# Patient Record
Sex: Female | Born: 1990 | Race: Black or African American | Hispanic: No | Marital: Single | Smoking: Never smoker
Health system: Southern US, Community
[De-identification: ages and names within clinical notes are randomized; demographics above are authoritative.]

## PROBLEM LIST (undated history)

## (undated) ENCOUNTER — Emergency Department (HOSPITAL_COMMUNITY): Payer: Medicaid Other

## (undated) ENCOUNTER — Inpatient Hospital Stay (HOSPITAL_COMMUNITY): Payer: Self-pay

## (undated) ENCOUNTER — Emergency Department (HOSPITAL_BASED_OUTPATIENT_CLINIC_OR_DEPARTMENT_OTHER): Admission: EM | Payer: 59 | Source: Home / Self Care

## (undated) DIAGNOSIS — F419 Anxiety disorder, unspecified: Secondary | ICD-10-CM

## (undated) DIAGNOSIS — G8929 Other chronic pain: Secondary | ICD-10-CM

## (undated) DIAGNOSIS — I499 Cardiac arrhythmia, unspecified: Secondary | ICD-10-CM

## (undated) DIAGNOSIS — F32A Depression, unspecified: Secondary | ICD-10-CM

## (undated) DIAGNOSIS — O139 Gestational [pregnancy-induced] hypertension without significant proteinuria, unspecified trimester: Secondary | ICD-10-CM

## (undated) DIAGNOSIS — Z8619 Personal history of other infectious and parasitic diseases: Secondary | ICD-10-CM

## (undated) DIAGNOSIS — G510 Bell's palsy: Secondary | ICD-10-CM

## (undated) DIAGNOSIS — E669 Obesity, unspecified: Secondary | ICD-10-CM

## (undated) DIAGNOSIS — F329 Major depressive disorder, single episode, unspecified: Secondary | ICD-10-CM

## (undated) DIAGNOSIS — M549 Dorsalgia, unspecified: Secondary | ICD-10-CM

## (undated) HISTORY — PX: NO PAST SURGERIES: SHX2092

---
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
## 2010-09-14 ENCOUNTER — Emergency Department (HOSPITAL_BASED_OUTPATIENT_CLINIC_OR_DEPARTMENT_OTHER)
Admission: EM | Admit: 2010-09-14 | Discharge: 2010-09-14 | Disposition: A | Discharge status: Home / Self Care | Payer: 59 | Attending: Emergency Medicine | Admitting: Emergency Medicine

## 2010-09-14 ENCOUNTER — Emergency Department (INDEPENDENT_AMBULATORY_CARE_PROVIDER_SITE_OTHER): Payer: 59

## 2010-09-14 DIAGNOSIS — L0390 Cellulitis, unspecified: Secondary | ICD-10-CM

## 2010-09-14 DIAGNOSIS — G8929 Other chronic pain: Secondary | ICD-10-CM | POA: Insufficient documentation

## 2010-09-14 DIAGNOSIS — L0291 Cutaneous abscess, unspecified: Secondary | ICD-10-CM

## 2010-09-14 DIAGNOSIS — E669 Obesity, unspecified: Secondary | ICD-10-CM | POA: Insufficient documentation

## 2010-09-14 DIAGNOSIS — S39012A Strain of muscle, fascia and tendon of lower back, initial encounter: Secondary | ICD-10-CM

## 2010-09-14 DIAGNOSIS — M545 Low back pain, unspecified: Secondary | ICD-10-CM

## 2010-09-14 DIAGNOSIS — J45909 Unspecified asthma, uncomplicated: Secondary | ICD-10-CM | POA: Insufficient documentation

## 2010-09-14 DIAGNOSIS — X58XXXA Exposure to other specified factors, initial encounter: Secondary | ICD-10-CM | POA: Insufficient documentation

## 2010-09-14 DIAGNOSIS — IMO0002 Reserved for concepts with insufficient information to code with codable children: Secondary | ICD-10-CM | POA: Insufficient documentation

## 2010-09-14 HISTORY — DX: Other chronic pain: G89.29

## 2010-09-14 HISTORY — DX: Obesity, unspecified: E66.9

## 2010-09-14 HISTORY — DX: Dorsalgia, unspecified: M54.9

## 2010-09-14 LAB — URINALYSIS, ROUTINE W REFLEX MICROSCOPIC
Bilirubin Urine: NEGATIVE
Glucose, UA: NEGATIVE mg/dL
Ketones, ur: NEGATIVE mg/dL
Leukocytes, UA: NEGATIVE
Nitrite: NEGATIVE
Protein, ur: NEGATIVE mg/dL

## 2010-09-14 MED ORDER — HYDROCODONE-ACETAMINOPHEN 5-325 MG PO TABS
2.0000 | ORAL_TABLET | Freq: Once | ORAL | Status: AC
  Administered 2010-09-14: 2 via ORAL
  Filled 2010-09-14: qty 2

## 2010-09-14 MED ORDER — SULFAMETHOXAZOLE-TRIMETHOPRIM 800-160 MG PO TABS: 1.0000 | ORAL_TABLET | Freq: Two times a day (BID) | ORAL | Status: AC

## 2010-09-14 MED ORDER — DIAZEPAM 5 MG PO TABS: 5.0000 mg | ORAL_TABLET | Freq: Two times a day (BID) | ORAL | Status: AC

## 2010-09-14 MED ORDER — HYDROCODONE-ACETAMINOPHEN 5-500 MG PO TABS: 1.0000 | ORAL_TABLET | Freq: Four times a day (QID) | ORAL | Status: AC | PRN

## 2010-09-14 NOTE — ED Provider Notes (Addendum)
History     CSN: [id number] Arrival date & time: 09/14/2010  1:41 AM  Chief Complaint  Patient presents with  . Back Pain   Patient is a 20 y.o. female presenting with back pain. The history is provided by the patient.  Back Pain  This is a recurrent problem. The current episode started more than 1 week ago. The problem has been gradually worsening. The pain is associated with no known injury. The pain is present in the lumbar spine. The quality of the pain is described as burning. The pain does not radiate. The pain is moderate. The symptoms are aggravated by bending. Pertinent negatives include no chest pain, no fever, no abdominal pain, no bowel incontinence, no bladder incontinence, no dysuria, no pelvic pain, no tingling and no weakness. Risk factors include obesity.    Past Medical History  Diagnosis Date  . Obesity   . Chronic back pain     d/t "the size of my boobs"  . Asthma     History reviewed. No pertinent past surgical history.  History reviewed. No pertinent family history.  History  Substance Use Topics  . Smoking status: Never Smoker   . Smokeless tobacco: Never Used  . Alcohol Use: No    OB History    Grav Para Term Preterm Abortions TAB SAB Ect Mult Living                  Review of Systems  Constitutional: Negative for fever.  HENT: Negative.   Eyes: Negative.   Respiratory: Negative.   Cardiovascular: Negative.  Negative for chest pain.  Gastrointestinal: Negative.  Negative for abdominal pain and bowel incontinence.  Genitourinary: Negative for bladder incontinence, dysuria, frequency, hematuria, flank pain, vaginal bleeding, vaginal discharge, difficulty urinating and pelvic pain.  Musculoskeletal: Positive for back pain.  Neurological: Negative for dizziness, tingling and weakness.    Physical Exam  BP 132/91  Pulse 90  Temp(Src) 98.3 F (36.8 C) (Oral)  Ht 4\' 10"  (1.473 m)  Wt 180 lb (81.647 kg)  BMI 37.62 kg/m2  SpO2 100%  LMP  08/24/2010  Physical Exam  Constitutional: She appears well-developed and well-nourished.  HENT:  Head: Normocephalic and atraumatic.  Eyes: Pupils are equal, round, and reactive to light.  Neck: Normal range of motion. Neck supple.  Cardiovascular: Normal rate, regular rhythm and normal heart sounds.   Pulmonary/Chest: Effort normal and breath sounds normal.  Abdominal: Soft. Bowel sounds are normal. There is no tenderness.  Musculoskeletal: Normal range of motion.       Mild tenderness to left lower lumbar paraspinal area  Neurological: She is alert. She has normal strength. No sensory deficit.  Skin: Skin is warm and dry.    ED Course  Procedures  MDM  Results for orders placed during the hospital encounter of 09/14/10  URINALYSIS, ROUTINE W REFLEX MICROSCOPIC      Component Value Range   Color, Urine YELLOW  YELLOW    Appearance CLEAR  CLEAR    Specific Gravity, Urine 1.035 (*) 1.005 - 1.030    pH 5.5  5.0 - 8.0    Glucose, UA NEGATIVE  NEGATIVE (mg/dL)   Hgb urine dipstick NEGATIVE  NEGATIVE    Bilirubin Urine NEGATIVE  NEGATIVE    Ketones, ur NEGATIVE  NEGATIVE (mg/dL)   Protein, ur NEGATIVE  NEGATIVE (mg/dL)   Urobilinogen, UA 0.2  0.0 - 1.0 (mg/dL)   Nitrite NEGATIVE  NEGATIVE    Leukocytes, UA NEGATIVE  NEGATIVE  PREGNANCY, URINE      Component Value Range   Preg Test, Ur NEGATIVE     Dg Lumbar Spine Complete  09/14/2010  *RADIOLOGY REPORT*  Clinical Data: Lower back pain.  LUMBAR SPINE - COMPLETE 4+ VIEW  Comparison: None.  Findings: There is no evidence of fracture or subluxation. Vertebral bodies demonstrate normal height and alignment. Intervertebral disc spaces are preserved.  The visualized neural foramina are grossly unremarkable in appearance.  The visualized bowel gas pattern is unremarkable in appearance; air and stool are noted within the colon.  The sacroiliac joints are within normal limits.  IMPRESSION: No evidence of fracture or subluxation along the  lumbar spine.  Original Report Authenticated By: Tonia Ghent, M.D.    At discharge, pt asked me to look at place on her arm, has <1cm abscess to right axilla.  Looks like early abscess, no cellulits, not amenable at this point to I&D.  Will prescribe bactrim, advised to use hot compresses, return for any worsening.      Rolan Bucco, MD 09/14/10 1610  Rolan Bucco, MD 09/14/10 [phone]

## 2010-09-14 NOTE — ED Notes (Signed)
Pt given additional prescription and paper work. Pt to be dc'd home.

## 2010-09-14 NOTE — ED Notes (Signed)
Upon discharge, pt states there is a small bump under her right axilla that she would like the MD to look at. MD made aware.

## 2010-09-14 NOTE — ED Notes (Signed)
Pt presents tonight with multiple complaints.  Pt states that she has L lower back pain, L heel pain, and a bump in the R axilla.  Pt states that she is concerned about a kidney stone.  Denies dysuria, fever, chills, nausea, vomiting.

## 2014-02-28 ENCOUNTER — Emergency Department (HOSPITAL_COMMUNITY)
Admission: EM | Admit: 2014-02-28 | Discharge: 2014-03-01 | Disposition: A | Discharge status: Home / Self Care | Payer: Medicaid Other | Attending: Emergency Medicine | Admitting: Emergency Medicine

## 2014-02-28 ENCOUNTER — Emergency Department (HOSPITAL_COMMUNITY): Payer: Medicaid Other

## 2014-02-28 ENCOUNTER — Encounter (HOSPITAL_COMMUNITY): Payer: Self-pay | Admitting: Adult Health

## 2014-02-28 DIAGNOSIS — G8929 Other chronic pain: Secondary | ICD-10-CM | POA: Diagnosis not present

## 2014-02-28 DIAGNOSIS — R51 Headache: Secondary | ICD-10-CM | POA: Diagnosis present

## 2014-02-28 DIAGNOSIS — E669 Obesity, unspecified: Secondary | ICD-10-CM | POA: Insufficient documentation

## 2014-02-28 DIAGNOSIS — J45909 Unspecified asthma, uncomplicated: Secondary | ICD-10-CM | POA: Insufficient documentation

## 2014-02-28 DIAGNOSIS — R519 Headache, unspecified: Secondary | ICD-10-CM

## 2014-02-28 MED ORDER — SODIUM CHLORIDE 0.9 % IV BOLUS (SEPSIS)
1000.0000 mL | Freq: Once | INTRAVENOUS | Status: AC
  Administered 2014-02-28: 1000 mL via INTRAVENOUS

## 2014-02-28 MED ORDER — KETOROLAC TROMETHAMINE 30 MG/ML IJ SOLN
30.0000 mg | Freq: Once | INTRAMUSCULAR | Status: AC
  Administered 2014-02-28: 30 mg via INTRAVENOUS
  Filled 2014-02-28: qty 1

## 2014-02-28 MED ORDER — DIPHENHYDRAMINE HCL 50 MG/ML IJ SOLN
25.0000 mg | Freq: Once | INTRAMUSCULAR | Status: AC
  Administered 2014-02-28: 25 mg via INTRAVENOUS
  Filled 2014-02-28: qty 1

## 2014-02-28 MED ORDER — METOCLOPRAMIDE HCL 5 MG/ML IJ SOLN
10.0000 mg | Freq: Once | INTRAMUSCULAR | Status: AC
  Administered 2014-02-28: 10 mg via INTRAVENOUS
  Filled 2014-02-28: qty 2

## 2014-02-28 NOTE — ED Notes (Signed)
PA student at bedside.

## 2014-02-28 NOTE — ED Provider Notes (Signed)
CSN: [id number]     Arrival date & time 02/28/14  2218 History  This chart was scribed for non-physician practitioner, Roxy Horseman, PA-C working with Mirian Mo, MD by Luisa Dago, ED scribe. This patient was seen in room TR09C/TR09C and the patient's care was started at 11:06 PM.    Chief Complaint  Patient presents with  . Headache   The history is provided by the patient and medical records. No language interpreter was used.   HPI Comments: Madison Weiss is a 24 y.o. female who presents to the Emergency Department with a chief complaint of waxing and waning headache with onset approximately 3 weeks ago. Pt states that the pain is mostly localized to the left side of her head. She describes the pain as "throbbing" in nature. pt states that a couple of days ago she experienced an episode of blurry vision 2 days ago, but symptom is now resolved. Pt states that she has never experienced persistent headaches such as these. Usually they only last for a day. Pt states that the headache is at its worse upon waking in the AM and worse at night right before going to sleep. She admits to being seen by her PCP who advised her to take Ibuprofen or tylenol to alleviate her pain. Reports taking those OTC medications around the clock, but they have not provided substantial relief of her discomfort. No medicinal allergies. Denies any chance of pregnancy. Pt denies fever, neck pain, sore throat, visual disturbance, CP, cough, SOB, abdominal pain, nausea, emesis, diarrhea, urinary symptoms, back pain, weakness, numbness and rash as associated symptoms.      Past Medical History  Diagnosis Date  . Obesity   . Chronic back pain     d/t "the size of my boobs"  . Asthma    History reviewed. No pertinent past surgical history. History reviewed. No pertinent family history. History  Substance Use Topics  . Smoking status: Never Smoker   . Smokeless tobacco: Never Used  . Alcohol Use: No   OB  History    No data available     Review of Systems  Constitutional: Negative for fever and chills.  HENT: Negative for congestion.   Respiratory: Negative for shortness of breath.   Cardiovascular: Negative for chest pain.  Gastrointestinal: Negative for nausea, vomiting and abdominal pain.  Neurological: Positive for headaches. Negative for dizziness, weakness, light-headedness and numbness.   Allergies  Review of patient's allergies indicates no known allergies.  Home Medications   Prior to Admission medications   Medication Sig Start Date End Date Taking? Authorizing Provider  UNKNOWN TO PATIENT Birth control pills As prescribed      Historical Provider, MD   BP 124/80 mmHg  Pulse 105  Temp(Src) 97.8 F (36.6 C) (Oral)  Resp 18  SpO2 100%  LMP 02/14/2014 (Approximate)   Physical Exam  Constitutional: She is oriented to person, place, and time. She appears well-developed and well-nourished. No distress.  HENT:  Head: Normocephalic and atraumatic.  Right Ear: External ear normal.  Left Ear: External ear normal.  Eyes: Conjunctivae and EOM are normal. Pupils are equal, round, and reactive to light.  Neck: Normal range of motion. Neck supple. No tracheal deviation present.  No pain with neck flexion, no meningismus  Cardiovascular: Normal rate, regular rhythm and normal heart sounds.  Exam reveals no gallop and no friction rub.   No murmur heard. Pulmonary/Chest: Effort normal and breath sounds normal. No respiratory distress. She has no wheezes. She  has no rales. She exhibits no tenderness.  Abdominal: Soft. She exhibits no distension and no mass. There is no tenderness. There is no rebound and no guarding.  Musculoskeletal: Normal range of motion. She exhibits no edema or tenderness.  Normal gait.  Neurological: She is alert and oriented to person, place, and time. She has normal reflexes.  CN 3-12 intact, normal finger to nose, no pronator drift, sensation and  strength intact bilaterally.  Skin: Skin is warm and dry.  Psychiatric: She has a normal mood and affect. Her behavior is normal. Judgment and thought content normal.  Nursing note and vitals reviewed.   ED Course  Procedures (including critical care time)  DIAGNOSTIC STUDIES: Oxygen Saturation is 100% on RA, normal by my interpretation.    COORDINATION OF CARE: 11:10 PM- Will order a migraine cocktail. Advised pt to follow up with a neurologist to be evaluated. Pt advised of plan for treatment and pt agrees.  Medications  sodium chloride 0.9 % bolus 1,000 mL (not administered)  diphenhydrAMINE (BENADRYL) injection 25 mg (not administered)  ketorolac (TORADOL) 30 MG/ML injection 30 mg (not administered)  metoCLOPramide (REGLAN) injection 10 mg (not administered)   Imaging Review Ct Head Wo Contrast  03/01/2014   CLINICAL DATA:  Left-sided headache for 3 weeks.  EXAM: CT HEAD WITHOUT CONTRAST  TECHNIQUE: Contiguous axial images were obtained from the base of the skull through the vertex without intravenous contrast.  COMPARISON:  None.  FINDINGS: No intracranial hemorrhage, mass effect, or midline shift. No hydrocephalus. The basilar cisterns are patent. No evidence of territorial infarct. No intracranial fluid collection. Calvarium is intact. Minimal mucosal thickening in the sphenoid sinus. The mastoid air cells are well aerated.  IMPRESSION: 1. No acute intracranial abnormality. 2. Minimal mucosal thickening in the sphenoid sinus.   Electronically Signed   By: Rubye OaksMelanie  Ehinger M.D.   On: 03/01/2014 01:34    Images of head CT reviewed in the PACS system by me personally, I agree with radiologist's impression, no obvious mass or tumor.  MDM   Final diagnoses:  Headache, unspecified headache type    Patient with headache.  Persistent x 3 weeks.  Will check head CT to rule out any space occupying lesion.  Will give toradol, reglan, benadryl, and fluids.  12:10 AM Patient feels  shaky.  Likely dystonic reaction from reglan.  Patient refused additional benadryl.  Reassessed.  Patient states that the headache has significantly improved, but she still feels a little shaky.  Will continue fluids, CT scan pending.  CT is negative for mass or tumor.  Neurologically intact.  HA is now a 2/10.  Will discharge to home with neurology follow-up.  Patient understands and agrees with the plan.  She is stable and ready for discharge.  Pt HA treated and improved while in ED.  Presentation is like pts typical HA and non concerning for [facility], ICH, Meningitis, or temporal arteritis. Pt is afebrile with no focal neuro deficits, nuchal rigidity, or change in vision. Pt is to follow up with PCP to discuss prophylactic medication. Pt verbalizes understanding and is agreeable with plan to dc.    I personally performed the services described in this documentation, which was scribed in my presence. The recorded information has been reviewed and is accurate.    Roxy Horsemanobert Criss Bartles, PA-C 03/01/14 0139  Mirian MoMatthew Gentry, MD 03/01/14 [phone]

## 2014-02-28 NOTE — ED Notes (Addendum)
Pt reports front left sided headache that started 3wks ago. Pt reports pain is a throbbing sensation. Pt states she saw her pcp 3wks ago for same and was told to take ibuprofen/tylenol with no relief. Pt rates pain 6/10, states she has light and smell sensitivity with nausea.

## 2014-02-28 NOTE — ED Notes (Signed)
Presents with a headache for three weeks mostly on the left side of her head associated with blurry vision at times and nausea and light sensitivity. She has tried tylenol, ibuprofen with no relief. She is alert, orietned, no drift or droop. Headache is constant.

## 2014-03-01 ENCOUNTER — Encounter (HOSPITAL_COMMUNITY): Payer: Self-pay

## 2014-03-01 MED ORDER — DIPHENHYDRAMINE HCL 50 MG/ML IJ SOLN: 25.0000 mg | Freq: Once | INTRAMUSCULAR | Status: DC

## 2014-03-01 NOTE — Discharge Instructions (Signed)

## 2014-03-01 NOTE — ED Notes (Signed)
Patient transported to CT 

## 2014-03-01 NOTE — ED Notes (Addendum)
Pt starting having anxiety reaction to Reglan;brought back from CT w/o scan being completed; PA notified;pt states she does not want anything else; pt was moved to stretcher and placed in FastTrack 11;

## 2014-03-01 NOTE — ED Notes (Signed)
PA at bedside.

## 2014-03-30 ENCOUNTER — Other Ambulatory Visit (HOSPITAL_COMMUNITY): Payer: Self-pay | Admitting: Nurse Practitioner

## 2014-03-30 DIAGNOSIS — Z3A12 12 weeks gestation of pregnancy: Secondary | ICD-10-CM

## 2014-03-30 DIAGNOSIS — Z3689 Encounter for other specified antenatal screening: Secondary | ICD-10-CM

## 2014-03-30 DIAGNOSIS — Z3682 Encounter for antenatal screening for nuchal translucency: Secondary | ICD-10-CM

## 2014-03-30 LAB — OB RESULTS CONSOLE HIV ANTIBODY (ROUTINE TESTING): HIV: NONREACTIVE

## 2014-03-30 LAB — OB RESULTS CONSOLE GC/CHLAMYDIA
CHLAMYDIA, DNA PROBE: NEGATIVE
GC PROBE AMP, GENITAL: NEGATIVE

## 2014-03-30 LAB — OB RESULTS CONSOLE HEPATITIS B SURFACE ANTIGEN: Hepatitis B Surface Ag: NEGATIVE

## 2014-03-30 LAB — OB RESULTS CONSOLE ABO/RH: RH TYPE: POSITIVE

## 2014-03-30 LAB — OB RESULTS CONSOLE RPR: RPR: NONREACTIVE

## 2014-03-30 LAB — OB RESULTS CONSOLE RUBELLA ANTIBODY, IGM: Rubella: NON-IMMUNE/NOT IMMUNE

## 2014-05-03 ENCOUNTER — Encounter (HOSPITAL_COMMUNITY): Payer: Self-pay

## 2014-05-03 ENCOUNTER — Ambulatory Visit (HOSPITAL_COMMUNITY)
Admission: RE | Admit: 2014-05-03 | Discharge: 2014-05-03 | Disposition: A | Discharge status: Home / Self Care | Payer: Medicaid Other | Source: Ambulatory Visit | Attending: Nurse Practitioner | Admitting: Nurse Practitioner

## 2014-05-03 DIAGNOSIS — Z3A12 12 weeks gestation of pregnancy: Secondary | ICD-10-CM | POA: Diagnosis not present

## 2014-05-03 DIAGNOSIS — Z36 Encounter for antenatal screening of mother: Secondary | ICD-10-CM | POA: Insufficient documentation

## 2014-05-03 DIAGNOSIS — Z3682 Encounter for antenatal screening for nuchal translucency: Secondary | ICD-10-CM

## 2014-05-03 HISTORY — DX: Anxiety disorder, unspecified: F41.9

## 2014-05-03 HISTORY — DX: Major depressive disorder, single episode, unspecified: F32.9

## 2014-05-03 HISTORY — DX: Depression, unspecified: F32.A

## 2014-05-09 ENCOUNTER — Other Ambulatory Visit (HOSPITAL_COMMUNITY): Payer: Self-pay | Admitting: Nurse Practitioner

## 2014-06-23 ENCOUNTER — Ambulatory Visit (HOSPITAL_COMMUNITY): Admission: RE | Admit: 2014-06-23 | Payer: Medicaid Other | Source: Ambulatory Visit

## 2014-07-17 LAB — OB RESULTS CONSOLE RPR: RPR: NONREACTIVE

## 2014-08-12 ENCOUNTER — Emergency Department (HOSPITAL_COMMUNITY)
Admission: EM | Admit: 2014-08-12 | Discharge: 2014-08-12 | Disposition: A | Discharge status: Home / Self Care | Payer: Medicaid Other | Attending: Emergency Medicine | Admitting: Emergency Medicine

## 2014-08-12 ENCOUNTER — Emergency Department (HOSPITAL_COMMUNITY): Payer: Medicaid Other

## 2014-08-12 ENCOUNTER — Encounter (HOSPITAL_COMMUNITY): Payer: Self-pay | Admitting: Emergency Medicine

## 2014-08-12 DIAGNOSIS — J45901 Unspecified asthma with (acute) exacerbation: Secondary | ICD-10-CM | POA: Diagnosis not present

## 2014-08-12 DIAGNOSIS — G8929 Other chronic pain: Secondary | ICD-10-CM | POA: Insufficient documentation

## 2014-08-12 DIAGNOSIS — Z8659 Personal history of other mental and behavioral disorders: Secondary | ICD-10-CM | POA: Insufficient documentation

## 2014-08-12 DIAGNOSIS — R079 Chest pain, unspecified: Secondary | ICD-10-CM | POA: Diagnosis not present

## 2014-08-12 DIAGNOSIS — R002 Palpitations: Secondary | ICD-10-CM | POA: Diagnosis present

## 2014-08-12 DIAGNOSIS — E669 Obesity, unspecified: Secondary | ICD-10-CM | POA: Insufficient documentation

## 2014-08-12 LAB — BASIC METABOLIC PANEL
ANION GAP: 8 (ref 5–15)
BUN: 6 mg/dL (ref 6–20)
CALCIUM: 9.3 mg/dL (ref 8.9–10.3)
CHLORIDE: 105 mmol/L (ref 101–111)
CO2: 23 mmol/L (ref 22–32)
Creatinine, Ser: 0.68 mg/dL (ref 0.44–1.00)
GFR calc Af Amer: >60 mL/min (ref >60)
GFR calc non Af Amer: >60 mL/min (ref >60)
Glucose, Bld: 114 mg/dL — ABNORMAL HIGH (ref 65–99)
POTASSIUM: 4 mmol/L (ref 3.5–5.1)
SODIUM: 136 mmol/L (ref 135–145)

## 2014-08-12 LAB — URINALYSIS, ROUTINE W REFLEX MICROSCOPIC
Bilirubin Urine: NEGATIVE
GLUCOSE, UA: NEGATIVE mg/dL
Hgb urine dipstick: NEGATIVE
Ketones, ur: NEGATIVE mg/dL
Leukocytes, UA: NEGATIVE
Nitrite: NEGATIVE
PH: 6.5 (ref 5.0–8.0)
Protein, ur: NEGATIVE mg/dL
Specific Gravity, Urine: 1.015 (ref 1.005–1.030)
Urobilinogen, UA: 0.2 mg/dL (ref 0.0–1.0)

## 2014-08-12 LAB — DIFFERENTIAL
Basophils Absolute: 0 10*3/uL (ref 0.0–0.1)
Basophils Relative: 0 % (ref 0–1)
EOS ABS: 0.1 10*3/uL (ref 0.0–0.7)
EOS PCT: 0 % (ref 0–5)
LYMPHS ABS: 2.1 10*3/uL (ref 0.7–4.0)
LYMPHS PCT: 13 % (ref 12–46)
MONO ABS: 1.1 10*3/uL — AB (ref 0.1–1.0)
Monocytes Relative: 7 % (ref 3–12)
Neutro Abs: 13.5 10*3/uL — ABNORMAL HIGH (ref 1.7–7.7)
Neutrophils Relative %: 80 % — ABNORMAL HIGH (ref 43–77)

## 2014-08-12 LAB — CBC
HCT: 33.7 % — ABNORMAL LOW (ref 36.0–46.0)
Hemoglobin: 11.2 g/dL — ABNORMAL LOW (ref 12.0–15.0)
MCH: 26.8 pg (ref 26.0–34.0)
MCHC: 33.2 g/dL (ref 30.0–36.0)
MCV: 80.6 fL (ref 78.0–100.0)
PLATELETS: 260 10*3/uL (ref 150–400)
RBC: 4.18 MIL/uL (ref 3.87–5.11)
RDW: 13.4 % (ref 11.5–15.5)
WBC: 17.9 10*3/uL — AB (ref 4.0–10.5)

## 2014-08-12 LAB — I-STAT TROPONIN, ED: TROPONIN I, POC: 0.01 ng/mL (ref 0.00–0.08)

## 2014-08-12 LAB — D-DIMER, QUANTITATIVE: D-Dimer, Quant: 0.83 ug/mL-FEU — ABNORMAL HIGH (ref 0.00–0.48)

## 2014-08-12 LAB — TSH: TSH: 1.763 u[IU]/mL (ref 0.350–4.500)

## 2014-08-12 MED ORDER — IOHEXOL 350 MG/ML SOLN
80.0000 mL | Freq: Once | INTRAVENOUS | Status: AC | PRN
  Administered 2014-08-12: 80 mL via INTRAVENOUS

## 2014-08-12 MED ORDER — SODIUM CHLORIDE 0.9 % IV BOLUS (SEPSIS)
1000.0000 mL | Freq: Once | INTRAVENOUS | Status: AC
  Administered 2014-08-12: 1000 mL via INTRAVENOUS

## 2014-08-12 NOTE — ED Provider Notes (Addendum)
TIME SEEN: 2:55 AM  CHIEF COMPLAINT: Palpitations; Shortness of breath  HPI:  HPI Comments: Madison Weiss is a 24 y.o. female who is [redacted] weeks pregnant presents to the Emergency Department complaining of palpitations and shortness of breath x 2 days, worsening in the last 2 hours. She attributed the symptoms to increased stress today. Pt states that she is still having symptoms even though she feels less anxious now. No modifying factors. Pt had similar symptoms approximately 2 weeks ago but did not see a physician at that time. No fever, cough, chest pain, vomiting, diarrhea, abdominal pain, dysuria, hematuria, painful leg swelling, or any other symptoms. Pt's due date is: 11/05/2014. G1P0A0. Denies any complications with the pregnancy.  States she is feeling her baby move regularly. No vaginal discharge or bleeding.  ROS: See HPI Constitutional: no fever  Eyes: no drainage  ENT: no runny nose   Cardiovascular:  Palpitations. no chest pain  Resp: SOB. No cough GI: no nausea, vomiting, abdominal pain, diarrhea GU: no dysuria, hematuria Integumentary: no rash  Allergy: no hives  Musculoskeletal: no leg swelling  Neurological: no slurred speech ROS otherwise negative  PAST MEDICAL HISTORY/PAST SURGICAL HISTORY:  Past Medical History  Diagnosis Date  . Obesity   . Chronic back pain     d/t "the size of my boobs"  . Asthma   . Depression   . Anxiety     MEDICATIONS:  Prior to Admission medications   Not on File    ALLERGIES:  No Known Allergies  SOCIAL HISTORY:  History  Substance Use Topics  . Smoking status: Never Smoker   . Smokeless tobacco: Never Used  . Alcohol Use: No    FAMILY HISTORY: No family history on file.  EXAM: Triage Vitals: BP 136/81 mmHg  Pulse 114  Temp(Src) 98.4 F (36.9 C) (Oral)  Resp 22  SpO2 100%  LMP 02/03/2014 (Approximate)   CONSTITUTIONAL: Alert and oriented and responds appropriately to questions. Well-appearing; well-nourished,  obese HEAD: Normocephalic EYES: Conjunctivae clear, PERRL ENT: normal nose; no rhinorrhea; moist mucous membranes; pharynx without lesions noted NECK: Supple, no meningismus, no LAD  CARD: Regular rhythm and tachycardic; S1 and S2 appreciated; no murmurs, no clicks, no rubs, no gallops RESP: Normal chest excursion without splinting or tachypnea; breath sounds clear and equal bilaterally; no wheezes, no rhonchi, no rales, no hypoxia or respiratory distress, speaking full sentences ABD/GI: Normal bowel sounds; non-distended; soft, non-tender, no rebound, no guarding, no peritoneal signs. Gravid uterus BACK:  The back appears normal and is non-tender to palpation, there is no CVA tenderness EXT: Normal ROM in all joints; non-tender to palpation; no edema; normal capillary refill; no cyanosis, no calf tenderness or swelling    SKIN: Normal color for age and race; warm NEURO: Moves all extremities equally, sensation to light touch intact diffusely, cranial nerves II through XII intact PSYCH: The patient's mood and manner are appropriate. Grooming and personal hygiene are appropriate.  MEDICAL DECISION MAKING: Patient here with palpitations and tachycardia. She is also had shortness of breath but no chest pain. She is pregnant. She is at risk for pulmonary embolus given her pregnancy. We'll obtain labs, urine. Will give IV fluids.   ED PROGRESS: Patient's labs show leukocytosis of 17 with left shift. She has not had any fever. No coughing, headache or neck pain, abdominal pain, vomiting or diarrhea. No rash. D-dimer is elevated. Will obtain CT of her chest. Discussed with her risk of radiation to her fetus but I  feel given her symptoms I would recommend obtaining CT to rule out pulmonary embolus. Troponin and TSH also normal.   6:05 AM  Repeat temp is 98.4.  Tachycardia has improved with IV hydration but will intermittently jump again to the 120s and then back down to the low 100s. She is otherwise  hemodynamically stable. She is in sinus tachycardia. Her CT of her chest shows no pulmonary embolus, infiltrate or edema. Her urine shows no sign of infection. Fetal heart tones in the 150s. I feel she is safe to be discharged home. Have advised her to increase her fluid intake. Have discussed with her strict return precautions. She verbalized understanding and is comfortable with this plan.    I personally performed the services described in this documentation, which was scribed in my presence. The recorded information has been reviewed and is accurate.    Layla Maw Ward, DO 08/12/14 [id number]   EKG Interpretation  Date/Time:  Saturday August 12 2014 01:20:29 EDT Ventricular Rate:  115 PR Interval:  124 QRS Duration: 64 QT Interval:  298 QTC Calculation: 412 R Axis:   43 Text Interpretation:  Sinus tachycardia Otherwise normal ECG No old tracing to compare Confirmed by WARD,  DO, KRISTEN [id number]) on 08/12/2014 2:41:16 AM        Layla Maw Ward, DO 08/12/14 1191

## 2014-08-12 NOTE — ED Notes (Signed)
Patient transported to CT scan . 

## 2014-08-12 NOTE — ED Notes (Signed)
FHT= 152/MIN .

## 2014-08-12 NOTE — Discharge Instructions (Signed)

## 2014-08-12 NOTE — ED Notes (Signed)
[facility] - first [facility]

## 2014-08-12 NOTE — ED Notes (Addendum)
C/o palpitations, L sided chest pain, and sob over the past 2-3 days.  Pt states she is [redacted] weeks pregnant.

## 2014-09-26 ENCOUNTER — Ambulatory Visit (HOSPITAL_COMMUNITY)
Admission: RE | Admit: 2014-09-26 | Discharge: 2014-09-26 | Disposition: A | Discharge status: Home / Self Care | Payer: Medicaid Other | Source: Ambulatory Visit | Attending: Obstetrics | Admitting: Obstetrics

## 2014-09-26 ENCOUNTER — Other Ambulatory Visit (HOSPITAL_COMMUNITY): Payer: Self-pay | Admitting: Nurse Practitioner

## 2014-09-26 DIAGNOSIS — Z36 Encounter for antenatal screening of mother: Secondary | ICD-10-CM | POA: Insufficient documentation

## 2014-09-26 DIAGNOSIS — Z3689 Encounter for other specified antenatal screening: Secondary | ICD-10-CM

## 2014-10-03 ENCOUNTER — Inpatient Hospital Stay (HOSPITAL_COMMUNITY)
Admission: AD | Admit: 2014-10-03 | Discharge: 2014-10-05 | DRG: 781 | Disposition: A | Discharge status: Home / Self Care | Payer: Medicaid Other | Source: Ambulatory Visit | Attending: Obstetrics | Admitting: Obstetrics

## 2014-10-03 ENCOUNTER — Encounter (HOSPITAL_COMMUNITY): Payer: Self-pay | Admitting: *Deleted

## 2014-10-03 DIAGNOSIS — I499 Cardiac arrhythmia, unspecified: Secondary | ICD-10-CM | POA: Diagnosis present

## 2014-10-03 DIAGNOSIS — O9989 Other specified diseases and conditions complicating pregnancy, childbirth and the puerperium: Secondary | ICD-10-CM | POA: Diagnosis present

## 2014-10-03 DIAGNOSIS — O10913 Unspecified pre-existing hypertension complicating pregnancy, third trimester: Principal | ICD-10-CM | POA: Diagnosis present

## 2014-10-03 DIAGNOSIS — Z6841 Body Mass Index (BMI) 40.0 and over, adult: Secondary | ICD-10-CM

## 2014-10-03 DIAGNOSIS — O99213 Obesity complicating pregnancy, third trimester: Secondary | ICD-10-CM | POA: Diagnosis present

## 2014-10-03 DIAGNOSIS — F329 Major depressive disorder, single episode, unspecified: Secondary | ICD-10-CM | POA: Diagnosis present

## 2014-10-03 DIAGNOSIS — M549 Dorsalgia, unspecified: Secondary | ICD-10-CM | POA: Diagnosis present

## 2014-10-03 DIAGNOSIS — Z3A34 34 weeks gestation of pregnancy: Secondary | ICD-10-CM | POA: Diagnosis present

## 2014-10-03 DIAGNOSIS — O139 Gestational [pregnancy-induced] hypertension without significant proteinuria, unspecified trimester: Secondary | ICD-10-CM | POA: Diagnosis present

## 2014-10-03 DIAGNOSIS — F419 Anxiety disorder, unspecified: Secondary | ICD-10-CM | POA: Diagnosis present

## 2014-10-03 DIAGNOSIS — E669 Obesity, unspecified: Secondary | ICD-10-CM | POA: Diagnosis present

## 2014-10-03 DIAGNOSIS — O99343 Other mental disorders complicating pregnancy, third trimester: Secondary | ICD-10-CM | POA: Diagnosis present

## 2014-10-03 DIAGNOSIS — O99513 Diseases of the respiratory system complicating pregnancy, third trimester: Secondary | ICD-10-CM | POA: Diagnosis present

## 2014-10-03 DIAGNOSIS — O99353 Diseases of the nervous system complicating pregnancy, third trimester: Secondary | ICD-10-CM | POA: Diagnosis present

## 2014-10-03 DIAGNOSIS — G8929 Other chronic pain: Secondary | ICD-10-CM | POA: Diagnosis present

## 2014-10-03 DIAGNOSIS — O133 Gestational [pregnancy-induced] hypertension without significant proteinuria, third trimester: Secondary | ICD-10-CM

## 2014-10-03 DIAGNOSIS — Z23 Encounter for immunization: Secondary | ICD-10-CM

## 2014-10-03 DIAGNOSIS — J45909 Unspecified asthma, uncomplicated: Secondary | ICD-10-CM | POA: Diagnosis present

## 2014-10-03 DIAGNOSIS — O99413 Diseases of the circulatory system complicating pregnancy, third trimester: Secondary | ICD-10-CM | POA: Diagnosis present

## 2014-10-03 HISTORY — DX: Gestational (pregnancy-induced) hypertension without significant proteinuria, unspecified trimester: O13.9

## 2014-10-03 HISTORY — DX: Cardiac arrhythmia, unspecified: I49.9

## 2014-10-03 LAB — COMPREHENSIVE METABOLIC PANEL
ALT: 12 U/L — AB (ref 14–54)
AST: 20 U/L (ref 15–41)
Albumin: 2.7 g/dL — ABNORMAL LOW (ref 3.5–5.0)
Alkaline Phosphatase: 80 U/L (ref 38–126)
Anion gap: 8 (ref 5–15)
BUN: 7 mg/dL (ref 6–20)
CHLORIDE: 105 mmol/L (ref 101–111)
CO2: 23 mmol/L (ref 22–32)
CREATININE: 0.63 mg/dL (ref 0.44–1.00)
Calcium: 9 mg/dL (ref 8.9–10.3)
GFR calc Af Amer: >60 mL/min (ref >60)
GFR calc non Af Amer: >60 mL/min (ref >60)
Glucose, Bld: 77 mg/dL (ref 65–99)
Potassium: 4.1 mmol/L (ref 3.5–5.1)
SODIUM: 136 mmol/L (ref 135–145)
Total Bilirubin: 0.4 mg/dL (ref 0.3–1.2)
Total Protein: 6.6 g/dL (ref 6.5–8.1)

## 2014-10-03 LAB — URINALYSIS, ROUTINE W REFLEX MICROSCOPIC
Bilirubin Urine: NEGATIVE
Glucose, UA: NEGATIVE mg/dL
Ketones, ur: NEGATIVE mg/dL
Leukocytes, UA: NEGATIVE
Nitrite: NEGATIVE
PROTEIN: NEGATIVE mg/dL
SPECIFIC GRAVITY, URINE: 1.01 (ref 1.005–1.030)
Urobilinogen, UA: 0.2 mg/dL (ref 0.0–1.0)
pH: 6 (ref 5.0–8.0)

## 2014-10-03 LAB — LACTATE DEHYDROGENASE: LDH: 148 U/L (ref 98–192)

## 2014-10-03 LAB — CBC
HCT: 31.8 % — ABNORMAL LOW (ref 36.0–46.0)
Hemoglobin: 10.1 g/dL — ABNORMAL LOW (ref 12.0–15.0)
MCH: 23.7 pg — ABNORMAL LOW (ref 26.0–34.0)
MCHC: 31.8 g/dL (ref 30.0–36.0)
MCV: 74.5 fL — ABNORMAL LOW (ref 78.0–100.0)
PLATELETS: 298 10*3/uL (ref 150–400)
RBC: 4.27 MIL/uL (ref 3.87–5.11)
RDW: 15.1 % (ref 11.5–15.5)
WBC: 13.1 10*3/uL — AB (ref 4.0–10.5)

## 2014-10-03 LAB — ABO/RH: ABO/RH(D): A POS

## 2014-10-03 LAB — URINE MICROSCOPIC-ADD ON: RBC / HPF: NONE SEEN RBC/hpf (ref <3)

## 2014-10-03 LAB — TYPE AND SCREEN
ABO/RH(D): A POS
Antibody Screen: NEGATIVE

## 2014-10-03 LAB — PROTEIN / CREATININE RATIO, URINE
Creatinine, Urine: 59 mg/dL
Protein Creatinine Ratio: 0.24 mg/mg{Cre} — ABNORMAL HIGH (ref 0.00–0.15)
Total Protein, Urine: 14 mg/dL

## 2014-10-03 LAB — URIC ACID: URIC ACID, SERUM: 3.1 mg/dL (ref 2.3–6.6)

## 2014-10-03 LAB — OB RESULTS CONSOLE GBS: GBS: NEGATIVE

## 2014-10-03 MED ORDER — ACETAMINOPHEN 325 MG PO TABS
650.0000 mg | ORAL_TABLET | ORAL | Status: DC | PRN
  Administered 2014-10-04: 650 mg via ORAL
  Filled 2014-10-03: qty 2

## 2014-10-03 MED ORDER — CALCIUM CARBONATE ANTACID 500 MG PO CHEW
2.0000 | CHEWABLE_TABLET | ORAL | Status: DC | PRN
  Administered 2014-10-03: 400 mg via ORAL
  Filled 2014-10-03: qty 2

## 2014-10-03 MED ORDER — ZOLPIDEM TARTRATE 5 MG PO TABS: 5.0000 mg | ORAL_TABLET | Freq: Every evening | ORAL | Status: DC | PRN

## 2014-10-03 MED ORDER — INFLUENZA VAC SPLIT QUAD 0.5 ML IM SUSY
0.5000 mL | PREFILLED_SYRINGE | INTRAMUSCULAR | Status: AC
  Administered 2014-10-04: 0.5 mL via INTRAMUSCULAR
  Filled 2014-10-03: qty 0.5

## 2014-10-03 MED ORDER — DOCUSATE SODIUM 100 MG PO CAPS
100.0000 mg | ORAL_CAPSULE | Freq: Every day | ORAL | Status: DC
  Administered 2014-10-04: 100 mg via ORAL
  Filled 2014-10-03: qty 1

## 2014-10-03 MED ORDER — LABETALOL HCL 100 MG PO TABS: 200.0000 mg | ORAL_TABLET | Freq: Three times a day (TID) | ORAL | Status: DC

## 2014-10-03 MED ORDER — PRENATAL MULTIVITAMIN CH
1.0000 | ORAL_TABLET | Freq: Every day | ORAL | Status: DC
  Administered 2014-10-04: 1 via ORAL
  Filled 2014-10-03: qty 1

## 2014-10-03 NOTE — MAU Note (Signed)
Pt sent from MD office with elevated BP.  Pt denies HA, visual changes or epigastric pain.  Has lower abd & pelvic pressure, irregular uc's, denies bleeding or LOF.

## 2014-10-03 NOTE — MAU Provider Note (Signed)
History   [id number]   Chief Complaint  Patient presents with  . Hypertension    First Provider Initiated Contact with Patient 10/03/14 1157       HPI Madison Weiss is a 23 y.o. female  G1P0 sent from the office for elevated BP. BPs in office today were 140s-80s. Denies history of HTN.  Denies headache, visual changes, or epigastric pain.  Denies contractions, vaginal bleeding or LOF.  Positive fetal movement.    Patient's last menstrual period was 02/03/2014 (approximate).  OB History  Gravida Para Term Preterm AB SAB TAB Ectopic Multiple Living  1             # Outcome Date GA Lbr Len/2nd Weight Sex Delivery Anes PTL Lv  1 Current               Past Medical History  Diagnosis Date  . Obesity   . Chronic back pain     d/t "the size of my boobs"  . Asthma   . Depression   . Anxiety   . Arrhythmia     currently seeing cardiologist    History reviewed. No pertinent family history.  Social History   Social History  . Marital Status: Single    Spouse Name: N/A  . Number of Children: N/A  . Years of Education: N/A   Social History Main Topics  . Smoking status: Never Smoker   . Smokeless tobacco: Never Used  . Alcohol Use: No  . Drug Use: No  . Sexual Activity: Yes    Birth Control/ Protection: None     Comment: pregnant   Other Topics Concern  . None   Social History Narrative    No Known Allergies  No current facility-administered medications on file prior to encounter.   No current outpatient prescriptions on file prior to encounter.     Review of Systems  HENT: Negative.   Respiratory: Negative.   Cardiovascular: Negative.   Gastrointestinal: Negative.   Genitourinary: Negative.  Negative for vaginal bleeding.     Physical Exam   Filed Vitals:   10/03/14 1231 10/03/14 1246 10/03/14 1301 10/03/14 1316  BP: 144/88 136/92 143/93 146/94  Pulse: 111 112 105 121  Temp:      TempSrc:      Resp:      SpO2:        Physical Exam   Nursing note and vitals reviewed. Constitutional: She is oriented to person, place, and time. She appears well-developed and well-nourished. No distress.  HENT:  Head: Normocephalic and atraumatic.  Eyes: Conjunctivae are normal. Right eye exhibits no discharge. Left eye exhibits no discharge. No scleral icterus.  Neck: Normal range of motion.  Cardiovascular: Normal rate, regular rhythm and normal heart sounds.   No murmur heard. Respiratory: Effort normal and breath sounds normal. No respiratory distress. She has no wheezes.  GI: Soft. There is no tenderness.  Musculoskeletal: She exhibits no tenderness. Edema: BLE edema.  Neurological: She is alert and oriented to person, place, and time. She has normal reflexes.  No clonus  Skin: Skin is warm and dry. She is not diaphoretic.  Psychiatric: She has a normal mood and affect. Her behavior is normal. Judgment and thought content normal.    Fetal Tracing:  Baseline: 150 Variability: moderate Accelerations: 15x15 Decelerations: none  Toco: irregular contractions    MAU Course  Procedures Results for orders placed or performed during the hospital encounter of 10/03/14 (from the past 24 hour(s))  Urinalysis, Routine w reflex microscopic (not at [facility])     Status: Abnormal   Collection Time: 10/03/14 11:20 AM  Result Value Ref Range   Color, Urine YELLOW YELLOW   APPearance CLEAR CLEAR   Specific Gravity, Urine 1.010 1.005 - 1.030   pH 6.0 5.0 - 8.0   Glucose, UA NEGATIVE NEGATIVE mg/dL   Hgb urine dipstick TRACE (A) NEGATIVE   Bilirubin Urine NEGATIVE NEGATIVE   Ketones, ur NEGATIVE NEGATIVE mg/dL   Protein, ur NEGATIVE NEGATIVE mg/dL   Urobilinogen, UA 0.2 0.0 - 1.0 mg/dL   Nitrite NEGATIVE NEGATIVE   Leukocytes, UA NEGATIVE NEGATIVE  Protein / creatinine ratio, urine     Status: Abnormal   Collection Time: 10/03/14 11:20 AM  Result Value Ref Range   Creatinine, Urine 59.00 mg/dL   Total Protein, Urine 14 mg/dL    Protein Creatinine Ratio 0.24 (H) 0.00 - 0.15 mg/mg[Cre]  Urine microscopic-add on     Status: None   Collection Time: 10/03/14 11:20 AM  Result Value Ref Range   Squamous Epithelial / LPF RARE RARE   RBC / HPF  <3 RBC/hpf    NO FORMED ELEMENTS SEEN ON URINE MICROSCOPIC EXAMINATION   Bacteria, UA RARE RARE  CBC     Status: Abnormal   Collection Time: 10/03/14 12:09 PM  Result Value Ref Range   WBC 13.1 (H) 4.0 - 10.5 K/uL   RBC 4.27 3.87 - 5.11 MIL/uL   Hemoglobin 10.1 (L) 12.0 - 15.0 g/dL   HCT 16.1 (L) 09.6 - 04.5 %   MCV 74.5 (L) 78.0 - 100.0 fL   MCH 23.7 (L) 26.0 - 34.0 pg   MCHC 31.8 30.0 - 36.0 g/dL   RDW 40.9 81.1 - 91.4 %   Platelets 298 150 - 400 K/uL  Comprehensive metabolic panel     Status: Abnormal   Collection Time: 10/03/14 12:09 PM  Result Value Ref Range   Sodium 136 135 - 145 mmol/L   Potassium 4.1 3.5 - 5.1 mmol/L   Chloride 105 101 - 111 mmol/L   CO2 23 22 - 32 mmol/L   Glucose, Bld 77 65 - 99 mg/dL   BUN 7 6 - 20 mg/dL   Creatinine, Ser 7.82 0.44 - 1.00 mg/dL   Calcium 9.0 8.9 - 95.6 mg/dL   Total Protein 6.6 6.5 - 8.1 g/dL   Albumin 2.7 (L) 3.5 - 5.0 g/dL   AST 20 15 - 41 U/L   ALT 12 (L) 14 - 54 U/L   Alkaline Phosphatase 80 38 - 126 U/L   Total Bilirubin 0.4 0.3 - 1.2 mg/dL   GFR calc non Af Amer >60 >60 mL/min   GFR calc Af Amer >60 >60 mL/min   Anion gap 8 5 - 15  Lactate dehydrogenase     Status: None   Collection Time: 10/03/14 12:09 PM  Result Value Ref Range   LDH 148 98 - 192 U/L  Uric acid     Status: None   Collection Time: 10/03/14 12:09 PM  Result Value Ref Range   Uric Acid, Serum 3.1 2.3 - 6.6 mg/dL    MDM Labs: protein creatinine ratio, CBC, CMP, LDH, uric acid 1306-C/w Dr. Gaynell Face. Will admit to anetnatal  Assessment and Plan  24 y.o. G1P0 at [redacted]w[redacted]d IUP  1. Gestational hypertension, third trimester    P: Admit to antenatal unit  Judeth Horn, NP 10/03/2014 11:57 AM

## 2014-10-03 NOTE — Progress Notes (Signed)
Pt unaware of contractions

## 2014-10-03 NOTE — MAU Note (Signed)
Report called to [facility] in Antenatal

## 2014-10-04 LAB — COMPREHENSIVE METABOLIC PANEL
ALT: 12 U/L — AB (ref 14–54)
AST: 18 U/L (ref 15–41)
Albumin: 2.4 g/dL — ABNORMAL LOW (ref 3.5–5.0)
Alkaline Phosphatase: 79 U/L (ref 38–126)
Anion gap: 6 (ref 5–15)
BUN: 7 mg/dL (ref 6–20)
CHLORIDE: 106 mmol/L (ref 101–111)
CO2: 23 mmol/L (ref 22–32)
CREATININE: 0.59 mg/dL (ref 0.44–1.00)
Calcium: 9 mg/dL (ref 8.9–10.3)
GFR calc non Af Amer: >60 mL/min (ref >60)
Glucose, Bld: 86 mg/dL (ref 65–99)
POTASSIUM: 4.4 mmol/L (ref 3.5–5.1)
SODIUM: 135 mmol/L (ref 135–145)
Total Bilirubin: 0.3 mg/dL (ref 0.3–1.2)
Total Protein: 5.8 g/dL — ABNORMAL LOW (ref 6.5–8.1)

## 2014-10-04 LAB — CBC
HCT: 31.7 % — ABNORMAL LOW (ref 36.0–46.0)
Hemoglobin: 10 g/dL — ABNORMAL LOW (ref 12.0–15.0)
MCH: 23.5 pg — ABNORMAL LOW (ref 26.0–34.0)
MCHC: 31.5 g/dL (ref 30.0–36.0)
MCV: 74.6 fL — ABNORMAL LOW (ref 78.0–100.0)
PLATELETS: 286 10*3/uL (ref 150–400)
RBC: 4.25 MIL/uL (ref 3.87–5.11)
RDW: 15.2 % (ref 11.5–15.5)
WBC: 12.6 10*3/uL — AB (ref 4.0–10.5)

## 2014-10-04 LAB — CREATININE, URINE, 24 HOUR
COLLECTION INTERVAL-UCRE24: 24 h
CREATININE, URINE: 125.71 mg/dL
Creatinine, 24H Ur: 1697 mg/d (ref 600–1800)
Urine Total Volume-UCRE24: 1350 mL

## 2014-10-04 LAB — PROTEIN, URINE, 24 HOUR
COLLECTION INTERVAL-UPROT: 24 h
PROTEIN, 24H URINE: 392 mg/d — AB (ref 50–100)
Protein, Urine: 29 mg/dL
Urine Total Volume-UPROT: 1350 mL

## 2014-10-04 NOTE — H&P (Signed)
This is Dr. Gracy Racer dictating the history and physical on  Madison Weiss  she's a 24 year old gravida 1 at 50 weeks and 5 days [facility] 11/10/2014 the patient is a known chronic hypertensive and was on blood pressure medication prior to pregnancy approximately 1 month ago she was started on labetalol because her blood pressures were elevated however she has not taken  Her medicatio n  she was seen in the office yesterday 10 pound weight gain in 2 weeks blood pressure  140/100 and she was sent to MAU where  c met  was normal and her protein creatinine ratio is elevated MFM was consulted and was decided  To admit  her to observe her blood pressures and  Do  24 urine collection which is being done at the present time since admission she has not been on labetalol because her blood pressures are all normal she denies any   headache or abdominal pain Past medical history history of hypertension  Prior  To  her pregnancy Past surgical history negative Social history denies smoking drinking or any drug use System review denies any Headache blurry negative vision epigastric pain Physical exam obese female in no distress HEENT negative Lungs clear to P&A Heart regular rhythm no murmurs no gallops Breasts negative Abdomen 36 week size massively obese Pelvic deferred Extremities 2+ edema

## 2014-10-04 NOTE — Progress Notes (Signed)
Pt c/o sharp pain in  Lower stomach and vagina. Placed on toco to monitor.

## 2014-10-05 NOTE — Discharge Summary (Signed)
  Patient's a 24 year old gravida 1 para 0 at 35 weeks who was admitted because her blood pressures were elevated in the office her  cmet  was normal and she had a 24 urine collection done for protein and creatinine and  They  were both normal her blood pressures  l all been normal and she is not on any medication at this time for blood pressure she will be discharged today to see me in one week6

## 2014-10-05 NOTE — Discharge Instructions (Signed)
Discharge instructions   You can wash your hair  Shower  Eat what you want  Drink what you want  See me in 6 weeks  Your ankles are going to swell more in the next 2 weeks than when pregnant  No sex for 6 weeks   Tomasita Beevers A, MD 10/05/2014

## 2014-10-06 ENCOUNTER — Ambulatory Visit (HOSPITAL_COMMUNITY): Admit: 2014-10-06 | Payer: Medicaid Other

## 2014-10-06 ENCOUNTER — Ambulatory Visit (HOSPITAL_COMMUNITY): Payer: Medicaid Other | Attending: Obstetrics

## 2014-10-13 ENCOUNTER — Inpatient Hospital Stay (HOSPITAL_COMMUNITY)
Admission: AD | Admit: 2014-10-13 | Discharge: 2014-10-20 | DRG: 765 | Disposition: A | Discharge status: Home / Self Care | Payer: Medicaid Other | Source: Ambulatory Visit | Attending: Obstetrics | Admitting: Obstetrics

## 2014-10-13 ENCOUNTER — Other Ambulatory Visit (HOSPITAL_COMMUNITY): Payer: Self-pay | Admitting: Obstetrics

## 2014-10-13 ENCOUNTER — Encounter (HOSPITAL_COMMUNITY): Payer: Self-pay

## 2014-10-13 ENCOUNTER — Ambulatory Visit (HOSPITAL_COMMUNITY)
Admission: RE | Admit: 2014-10-13 | Discharge: 2014-10-13 | Disposition: A | Discharge status: Home / Self Care | Payer: Medicaid Other | Source: Ambulatory Visit | Attending: Obstetrics | Admitting: Obstetrics

## 2014-10-13 ENCOUNTER — Encounter (HOSPITAL_COMMUNITY): Payer: Self-pay | Admitting: *Deleted

## 2014-10-13 DIAGNOSIS — Z3A36 36 weeks gestation of pregnancy: Secondary | ICD-10-CM

## 2014-10-13 DIAGNOSIS — O99214 Obesity complicating childbirth: Secondary | ICD-10-CM | POA: Diagnosis present

## 2014-10-13 DIAGNOSIS — O1403 Mild to moderate pre-eclampsia, third trimester: Principal | ICD-10-CM | POA: Diagnosis present

## 2014-10-13 DIAGNOSIS — O1490 Unspecified pre-eclampsia, unspecified trimester: Secondary | ICD-10-CM | POA: Diagnosis present

## 2014-10-13 DIAGNOSIS — Z6841 Body Mass Index (BMI) 40.0 and over, adult: Secondary | ICD-10-CM | POA: Diagnosis not present

## 2014-10-13 DIAGNOSIS — O1213 Gestational proteinuria, third trimester: Secondary | ICD-10-CM | POA: Diagnosis present

## 2014-10-13 DIAGNOSIS — Z98891 History of uterine scar from previous surgery: Secondary | ICD-10-CM

## 2014-10-13 DIAGNOSIS — O10913 Unspecified pre-existing hypertension complicating pregnancy, third trimester: Secondary | ICD-10-CM

## 2014-10-13 DIAGNOSIS — O1493 Unspecified pre-eclampsia, third trimester: Secondary | ICD-10-CM

## 2014-10-13 HISTORY — DX: Personal history of other infectious and parasitic diseases: Z86.19

## 2014-10-13 LAB — CBC
HEMATOCRIT: 31.7 % — AB (ref 36.0–46.0)
HEMOGLOBIN: 10 g/dL — AB (ref 12.0–15.0)
MCH: 23.3 pg — ABNORMAL LOW (ref 26.0–34.0)
MCHC: 31.5 g/dL (ref 30.0–36.0)
MCV: 73.7 fL — ABNORMAL LOW (ref 78.0–100.0)
Platelets: 286 10*3/uL (ref 150–400)
RBC: 4.3 MIL/uL (ref 3.87–5.11)
RDW: 15.3 % (ref 11.5–15.5)
WBC: 12.9 10*3/uL — AB (ref 4.0–10.5)

## 2014-10-13 LAB — COMPREHENSIVE METABOLIC PANEL
ALBUMIN: 2.6 g/dL — AB (ref 3.5–5.0)
ALK PHOS: 79 U/L (ref 38–126)
ALT: 12 U/L — ABNORMAL LOW (ref 14–54)
AST: 20 U/L (ref 15–41)
Anion gap: 6 (ref 5–15)
BILIRUBIN TOTAL: 0.4 mg/dL (ref 0.3–1.2)
BUN: 8 mg/dL (ref 6–20)
CALCIUM: 8.8 mg/dL — AB (ref 8.9–10.3)
CO2: 23 mmol/L (ref 22–32)
Chloride: 106 mmol/L (ref 101–111)
Creatinine, Ser: 0.57 mg/dL (ref 0.44–1.00)
GFR calc Af Amer: >60 mL/min (ref >60)
GFR calc non Af Amer: >60 mL/min (ref >60)
GLUCOSE: 92 mg/dL (ref 65–99)
Potassium: 3.9 mmol/L (ref 3.5–5.1)
Sodium: 135 mmol/L (ref 135–145)
TOTAL PROTEIN: 6 g/dL — AB (ref 6.5–8.1)

## 2014-10-13 LAB — LACTATE DEHYDROGENASE: LDH: 148 U/L (ref 98–192)

## 2014-10-13 LAB — PROTEIN / CREATININE RATIO, URINE
CREATININE, URINE: 201 mg/dL
PROTEIN CREATININE RATIO: 0.23 mg/mg{creat} — AB (ref 0.00–0.15)
TOTAL PROTEIN, URINE: 46 mg/dL

## 2014-10-13 LAB — URIC ACID: Uric Acid, Serum: 3.3 mg/dL (ref 2.3–6.6)

## 2014-10-13 MED ORDER — SODIUM CHLORIDE 0.9 % IJ SOLN: 3.0000 mL | INTRAMUSCULAR | Status: DC | PRN

## 2014-10-13 MED ORDER — SODIUM CHLORIDE 0.9 % IJ SOLN: 3.0000 mL | Freq: Two times a day (BID) | INTRAMUSCULAR | Status: DC

## 2014-10-13 MED ORDER — ACETAMINOPHEN 325 MG PO TABS
650.0000 mg | ORAL_TABLET | ORAL | Status: DC | PRN
  Administered 2014-10-15: 650 mg via ORAL
  Filled 2014-10-13: qty 2

## 2014-10-13 MED ORDER — PRENATAL MULTIVITAMIN CH
1.0000 | ORAL_TABLET | Freq: Every day | ORAL | Status: DC
  Administered 2014-10-14 – 2014-10-17 (×4): 1 via ORAL
  Filled 2014-10-13 (×4): qty 1

## 2014-10-13 MED ORDER — SODIUM CHLORIDE 0.9 % IV SOLN: 250.0000 mL | INTRAVENOUS | Status: DC | PRN

## 2014-10-13 MED ORDER — ZOLPIDEM TARTRATE 5 MG PO TABS: 5.0000 mg | ORAL_TABLET | Freq: Every evening | ORAL | Status: DC | PRN

## 2014-10-13 MED ORDER — DOCUSATE SODIUM 100 MG PO CAPS
100.0000 mg | ORAL_CAPSULE | Freq: Every day | ORAL | Status: DC
  Administered 2014-10-14 – 2014-10-17 (×4): 100 mg via ORAL
  Filled 2014-10-13 (×4): qty 1

## 2014-10-13 MED ORDER — CALCIUM CARBONATE ANTACID 500 MG PO CHEW
2.0000 | CHEWABLE_TABLET | ORAL | Status: DC | PRN
  Administered 2014-10-14 – 2014-10-15 (×2): 400 mg via ORAL
  Filled 2014-10-13 (×2): qty 2

## 2014-10-13 NOTE — MAU Note (Signed)
Pt has finished eating and told that she needs to be on monitor again, pt has consented. Antenatal unit called but bed is not clean yet.

## 2014-10-13 NOTE — MAU Note (Signed)
Pt being monitored prior to be admitted to Antenatal. Pt has come from MFM and was monitored there for 30 minutes.

## 2014-10-13 NOTE — Consult Note (Signed)
Maternal Fetal Medicine Consultation  Requesting Provider(s): Francoise Ceo, MD  Reason for consultation: Gestational hypertension vs. Preeclampsia  HPI: Madison Weiss is a 24 yo G1P0, EDD 11/10/2014 who is currently at 36w 0d seen for consultation due to gestational hypertension vs. Preeclampsia.  Ms. Buchan reports a 10 lb weight gain over the last 2 weeks.  She was recently admitted for observation.  Preeclampsia labs were within normal limits except for a 24 hr urine protein of 392 mg.  Overall, her blood pressures were normal with the exception of a few "mild range" blood pressures.  She denies headaches, visual changes or RUQ pain.  Ultrasound on 8/30 showed a fetus with an EFW > 90th %tile and normal amniotic fluid volume.  Her prenatal course has otherwise been uncomplicated.  She is without complaints today.  OB History: OB History    Gravida Para Term Preterm AB TAB SAB Ectopic Multiple Living   1               PMH:  Past Medical History  Diagnosis Date  . Obesity   . Chronic back pain     d/t "the size of my boobs"  . Asthma   . Depression   . Anxiety   . Arrhythmia     currently seeing cardiologist  . PIH (pregnancy induced hypertension) 10/03/2014    PSH:  Past Surgical History  Procedure Laterality Date  . No past surgeries     Meds:  Current Outpatient Prescriptions on File Prior to Encounter  Medication Sig Dispense Refill  . albuterol (PROVENTIL HFA;VENTOLIN HFA) 108 (90 BASE) MCG/ACT inhaler Inhale 1-2 puffs into the lungs every 6 (six) hours as needed for wheezing or shortness of breath.     No current facility-administered medications on file prior to encounter.   Allergies: No Known Allergies   FH: Denies birth defects or hereditary disorders.  Breast cancer in "both sides of the family"  Soc:  Social History   Social History  . Marital Status: Single    Spouse Name: N/A  . Number of Children: N/A  . Years of Education: N/A   Occupational  History  . Not on file.   Social History Main Topics  . Smoking status: Never Smoker   . Smokeless tobacco: Never Used  . Alcohol Use: No  . Drug Use: No  . Sexual Activity: Yes    Birth Control/ Protection: None     Comment: pregnant   Other Topics Concern  . Not on file   Social History Narrative    Review of Systems: no vaginal bleeding or cramping/contractions, no LOF, no nausea/vomiting. All other systems reviewed and are negative.   PE:   Filed Vitals:   10/13/14 0946  BP: 136/97  Initial BP in clinic: 151/96  GEN: well-appearing female ABD: gravid, NT  Reactive NST without contractions.  FHTs in the 140's.  Labs: CBC    Component Value Date/Time   WBC 12.9* 10/13/2014 0920   RBC 4.30 10/13/2014 0920   HGB 10.0* 10/13/2014 0920   HCT 31.7* 10/13/2014 0920   PLT 286 10/13/2014 0920   MCV 73.7* 10/13/2014 0920   MCH 23.3* 10/13/2014 0920   MCHC 31.5 10/13/2014 0920   RDW 15.3 10/13/2014 0920   LYMPHSABS 2.1 08/12/2014 0131   MONOABS 1.1* 08/12/2014 0131   EOSABS 0.1 08/12/2014 0131   BASOSABS 0.0 08/12/2014 0131   CMP     Component Value Date/Time   NA 135 10/13/2014 0920  K 3.9 10/13/2014 0920   CL 106 10/13/2014 0920   CO2 23 10/13/2014 0920   GLUCOSE 92 10/13/2014 0920   BUN 8 10/13/2014 0920   CREATININE 0.57 10/13/2014 0920   CALCIUM 8.8* 10/13/2014 0920   PROT 6.0* 10/13/2014 0920   ALBUMIN 2.6* 10/13/2014 0920   AST 20 10/13/2014 0920   ALT 12* 10/13/2014 0920   ALKPHOS 79 10/13/2014 0920   BILITOT 0.4 10/13/2014 0920   GFRNONAA >60 10/13/2014 0920   GFRAA >60 10/13/2014 0920   Uric acid: 3.3  LDH 138  A/P: 1) Single IUP at 36w 0d  2) Preeclampsia with mild features - based on a recent 24-hr urine protein > 300 and some labile blood pressures, the patient meets criteria for preeclampsia with mild features.  While preeclampsia with mild features may be managed as an outpatient, it is very difficult to determine how quickly the  patient will go onto develop severe features.  The patient does not have a history of chronic hypertension and it appears that her blood pressures have worsened over the last week.  Given this, my preference would be for admission and inpatient observation.  If the patient remains stable, would move toward delivery at 37 weeks.  In the meantime, if the patient develops severe range blood pressures or other severe features, would move toward delivery at the time of diagnosis.    Recommendations: 1) Admit for inpatient observation 2) Daily NSTs or more frequently as the clinical scenario dictates.  Weekly AFIs 3) Preeclampsia labs were ordered today; would check preeclampsia labs at least 2x weekly.   4) Would move toward delivery if the patient develops severe features (SBP >160 or DBP > 110; LFTs > 2x normal; plts < 100 K, Creat > 1.2, severe headaches that to not improve with medications). 5) If otherwise stable, recommend delivery at 37 weeks  Thank you for the opportunity to be a part of the care of [employer]. Please contact our office if we can be of further assistance.   I spent approximately 30 minutes with this patient with over 50% of time spent in face-to-face counseling.  Alpha Gula, MD Maternal Fetal Medicine

## 2014-10-13 NOTE — Progress Notes (Signed)
Orders obtained for admission 

## 2014-10-13 NOTE — MAU Note (Signed)
Pt would like to wait for saline lock until L&D.

## 2014-10-13 NOTE — Plan of Care (Signed)
Problem: Consults Goal: Birthing Suites Patient Information Press F2 to bring up selections list Outcome: Completed/Met Date Met:  10/13/14  Pt < [redacted] weeks EGA

## 2014-10-14 NOTE — Progress Notes (Signed)
Patient ID: Madison Weiss, female   DOB: [birth year], 24 y.o.   MRN: [id number] Explained to patient that she will be 37 weeks on Wednesday and at that time we will start induction however patient wants induction to be delayed until Sunday because she has a baby showup planned for Saturday I explained to her that the desire for a baby shower has nothing to do with the management of preeclampsia and my job is to get her  A living baby a nd to limit the pobbible complications  Will discuss with mfm

## 2014-10-14 NOTE — H&P (Signed)
This is Dr. Gracy Racer dictating the history and physical on  Madison Weiss she's a 24 year old gravida 1 at 27 weeks and 2 days she has a negative GBS and her EDC is 11/08/2014 she's been followed by MFM because of increased blood pressures and proteinuria she was hospitalized on 9 06/16 for 48 hrs   hours and a 24 urine showed increased  Proteinuria  her blood pressures were normal and she was discharged she was seen in the office 2 days ago   blood pressure was normal negative protein she was seen by MFM yesterday blood pressures elevated c met  normal protein creatinine 0.23 platelets 283 and MFM wanted her admitted  37 weeks at which time we'll inducer her blood pressures 121/53 this morning her highest blood pressure overnight was 147/100   she's not having any contractions she is not having any headache chest pain and abdominal pain  Or  right upper quadrant pain   Past medical history negative Past surgical history negative Social history denies smoking drinking alcohol use Family history noncontributory System review negative Physical exam obese female in no distress HEENT negative Lungs clear to P&A Heart regular rhythm no murmurs no gallops Breasts negative Abdomen 36 week size Cervix long closed vertex presentation -3 Extremities 1+ edema

## 2014-10-15 LAB — TYPE AND SCREEN
ABO/RH(D): A POS
Antibody Screen: NEGATIVE

## 2014-10-15 LAB — COMPREHENSIVE METABOLIC PANEL
ALBUMIN: 2.5 g/dL — AB (ref 3.5–5.0)
ALK PHOS: 77 U/L (ref 38–126)
ALT: 11 U/L — AB (ref 14–54)
AST: 17 U/L (ref 15–41)
Anion gap: 8 (ref 5–15)
BUN: 10 mg/dL (ref 6–20)
CALCIUM: 9.1 mg/dL (ref 8.9–10.3)
CO2: 23 mmol/L (ref 22–32)
CREATININE: 0.6 mg/dL (ref 0.44–1.00)
Chloride: 104 mmol/L (ref 101–111)
GFR calc Af Amer: >60 mL/min (ref >60)
GFR calc non Af Amer: >60 mL/min (ref >60)
GLUCOSE: 96 mg/dL (ref 65–99)
Potassium: 4.2 mmol/L (ref 3.5–5.1)
SODIUM: 135 mmol/L (ref 135–145)
Total Bilirubin: 0.2 mg/dL — ABNORMAL LOW (ref 0.3–1.2)
Total Protein: 6.4 g/dL — ABNORMAL LOW (ref 6.5–8.1)

## 2014-10-15 NOTE — Progress Notes (Signed)
PT declining to have her QD monitoring at this time although allowing the RN to doppler FHT at this time.

## 2014-10-15 NOTE — Progress Notes (Signed)
Patient ID: Madison Weiss, female   DOB: 11/10/1990, 24 y.o.   MRN: [id number] Eyes blood pressure 146/80 in the past 24 hours and blood pressure no one 26/67 respiration 20 pulse 99 No headache epigastric pain or blurring of the vision Being monitored fine on a half everyday 1+ edema No change in management  At this time

## 2014-10-16 NOTE — Progress Notes (Signed)
Patient ID: Madison Weiss, female   DOB: 1990-06-28, 24 y.o.   MRN: [id number] Blood pressure 137/71 respiration 18 pulse 106 she had CMP yesterday which was normal new CMP is ordered by lowered a CBC for tomorrow morning patient-year-old is asymptomatic we will discuss her issues with MFM today

## 2014-10-16 NOTE — Progress Notes (Signed)
Patient ID: Madison Weiss, female   DOB: [birth year], 24 y.o.   MRN: [id number] Patient keeps saying that she wants her induction to be delayed until after her baby shower on Saturday coming I explained to her preeclampsia is a progressive disease and we plan to induce her at 37 weeks on Wednesday told the patient she had a choice o f induction  On  Wednesday or to sign herself out of the hospital on Wednesday and return on Sunday for induction but she has to understand that this is a progressive disease and she can end up with a eclampsia are with severe consequences to herself and her baby this was discussed with Dr. Sherrie George this morning who agrees and this was relayed to the patient and the patient says she will let me know tomorrow what her plans are

## 2014-10-17 ENCOUNTER — Encounter (HOSPITAL_COMMUNITY): Payer: Self-pay | Admitting: Emergency Medicine

## 2014-10-17 ENCOUNTER — Inpatient Hospital Stay (HOSPITAL_COMMUNITY): Payer: Medicaid Other | Admitting: Anesthesiology

## 2014-10-17 ENCOUNTER — Encounter (HOSPITAL_COMMUNITY)
Admission: AD | Disposition: A | Discharge status: Home / Self Care | Payer: Self-pay | Source: Ambulatory Visit | Attending: Obstetrics

## 2014-10-17 DIAGNOSIS — Z98891 History of uterine scar from previous surgery: Secondary | ICD-10-CM

## 2014-10-17 LAB — COMPREHENSIVE METABOLIC PANEL
ALBUMIN: 2.4 g/dL — AB (ref 3.5–5.0)
ALT: 12 U/L — AB (ref 14–54)
AST: 19 U/L (ref 15–41)
Alkaline Phosphatase: 77 U/L (ref 38–126)
Anion gap: 7 (ref 5–15)
BUN: 10 mg/dL (ref 6–20)
CHLORIDE: 105 mmol/L (ref 101–111)
CO2: 26 mmol/L (ref 22–32)
CREATININE: 0.61 mg/dL (ref 0.44–1.00)
Calcium: 9.3 mg/dL (ref 8.9–10.3)
GFR calc Af Amer: >60 mL/min (ref >60)
GLUCOSE: 106 mg/dL — AB (ref 65–99)
Potassium: 4.4 mmol/L (ref 3.5–5.1)
SODIUM: 138 mmol/L (ref 135–145)
Total Bilirubin: 0.3 mg/dL (ref 0.3–1.2)
Total Protein: 5.8 g/dL — ABNORMAL LOW (ref 6.5–8.1)

## 2014-10-17 LAB — RAPID HIV SCREEN (HIV 1/2 AB+AG)
HIV 1/2 Antibodies: NONREACTIVE
HIV-1 P24 Antigen - HIV24: NONREACTIVE

## 2014-10-17 LAB — CBC
HCT: 31.8 % — ABNORMAL LOW (ref 36.0–46.0)
Hemoglobin: 10 g/dL — ABNORMAL LOW (ref 12.0–15.0)
MCH: 23.2 pg — ABNORMAL LOW (ref 26.0–34.0)
MCHC: 31.4 g/dL (ref 30.0–36.0)
MCV: 73.8 fL — AB (ref 78.0–100.0)
PLATELETS: 280 10*3/uL (ref 150–400)
RBC: 4.31 MIL/uL (ref 3.87–5.11)
RDW: 15.6 % — ABNORMAL HIGH (ref 11.5–15.5)
WBC: 15.8 10*3/uL — AB (ref 4.0–10.5)

## 2014-10-17 SURGERY — Surgical Case
Anesthesia: Spinal

## 2014-10-17 MED ORDER — NALOXONE HCL 1 MG/ML IJ SOLN: 1.0000 ug/kg/h | INTRAVENOUS | Status: DC | PRN

## 2014-10-17 MED ORDER — DIPHENHYDRAMINE HCL 50 MG/ML IJ SOLN
12.5000 mg | INTRAMUSCULAR | Status: DC | PRN
  Filled 2014-10-17: qty 1

## 2014-10-17 MED ORDER — IBUPROFEN 600 MG PO TABS
600.0000 mg | ORAL_TABLET | Freq: Four times a day (QID) | ORAL | Status: DC
  Administered 2014-10-18 – 2014-10-20 (×9): 600 mg via ORAL
  Filled 2014-10-17 (×10): qty 1

## 2014-10-17 MED ORDER — WITCH HAZEL-GLYCERIN EX PADS: 1.0000 "application " | MEDICATED_PAD | CUTANEOUS | Status: DC | PRN

## 2014-10-17 MED ORDER — SODIUM CHLORIDE 0.9 % IJ SOLN: 3.0000 mL | INTRAMUSCULAR | Status: DC | PRN

## 2014-10-17 MED ORDER — NALBUPHINE HCL 10 MG/ML IJ SOLN: 5.0000 mg | INTRAMUSCULAR | Status: DC | PRN

## 2014-10-17 MED ORDER — CITRIC ACID-SODIUM CITRATE 334-500 MG/5ML PO SOLN
ORAL | Status: AC
  Administered 2014-10-17: 30 mL via ORAL
  Filled 2014-10-17: qty 15

## 2014-10-17 MED ORDER — SENNOSIDES-DOCUSATE SODIUM 8.6-50 MG PO TABS
2.0000 | ORAL_TABLET | ORAL | Status: DC
  Administered 2014-10-18 – 2014-10-20 (×3): 2 via ORAL
  Filled 2014-10-17 (×4): qty 2

## 2014-10-17 MED ORDER — SODIUM CHLORIDE 0.9 % IR SOLN
Status: DC | PRN
  Administered 2014-10-17: 1000 mL

## 2014-10-17 MED ORDER — FENTANYL CITRATE (PF) 100 MCG/2ML IJ SOLN
INTRAMUSCULAR | Status: DC | PRN
  Administered 2014-10-17: 10 ug via INTRATHECAL

## 2014-10-17 MED ORDER — DIBUCAINE 1 % RE OINT: 1.0000 "application " | TOPICAL_OINTMENT | RECTAL | Status: DC | PRN

## 2014-10-17 MED ORDER — PHENYLEPHRINE 8 MG IN D5W 100 ML (0.08MG/ML) PREMIX OPTIME
INJECTION | INTRAVENOUS | Status: AC
  Filled 2014-10-17: qty 100

## 2014-10-17 MED ORDER — CEFAZOLIN SODIUM-DEXTROSE 2-3 GM-% IV SOLR
2.0000 g | Freq: Once | INTRAVENOUS | Status: AC
  Administered 2014-10-17: 2 g via INTRAVENOUS
  Filled 2014-10-17: qty 50

## 2014-10-17 MED ORDER — LACTATED RINGERS IV SOLN
INTRAVENOUS | Status: DC
  Administered 2014-10-17 (×2): via INTRAVENOUS

## 2014-10-17 MED ORDER — LANOLIN HYDROUS EX OINT: 1.0000 "application " | TOPICAL_OINTMENT | CUTANEOUS | Status: DC | PRN

## 2014-10-17 MED ORDER — OXYTOCIN 10 UNIT/ML IJ SOLN
INTRAMUSCULAR | Status: AC
  Filled 2014-10-17: qty 4

## 2014-10-17 MED ORDER — LACTATED RINGERS IV BOLUS (SEPSIS): 500.0000 mL | Freq: Once | INTRAVENOUS | Status: DC

## 2014-10-17 MED ORDER — PHENYLEPHRINE 8 MG IN D5W 100 ML (0.08MG/ML) PREMIX OPTIME
INJECTION | INTRAVENOUS | Status: DC | PRN
  Administered 2014-10-17: 40 ug/min via INTRAVENOUS

## 2014-10-17 MED ORDER — MENTHOL 3 MG MT LOZG
1.0000 | LOZENGE | OROMUCOSAL | Status: DC | PRN
  Filled 2014-10-17: qty 9

## 2014-10-17 MED ORDER — ACETAMINOPHEN 325 MG PO TABS
650.0000 mg | ORAL_TABLET | ORAL | Status: DC | PRN
  Administered 2014-10-18: 650 mg via ORAL
  Filled 2014-10-17: qty 2

## 2014-10-17 MED ORDER — NALOXONE HCL 0.4 MG/ML IJ SOLN: 0.4000 mg | INTRAMUSCULAR | Status: DC | PRN

## 2014-10-17 MED ORDER — BUPIVACAINE IN DEXTROSE 0.75-8.25 % IT SOLN
INTRATHECAL | Status: DC | PRN
  Administered 2014-10-17: 1.6 mL via INTRATHECAL

## 2014-10-17 MED ORDER — ONDANSETRON HCL 4 MG/2ML IJ SOLN: 4.0000 mg | Freq: Three times a day (TID) | INTRAMUSCULAR | Status: DC | PRN

## 2014-10-17 MED ORDER — MORPHINE SULFATE (PF) 0.5 MG/ML IJ SOLN
INTRAMUSCULAR | Status: AC
  Filled 2014-10-17: qty 100

## 2014-10-17 MED ORDER — KETOROLAC TROMETHAMINE 30 MG/ML IJ SOLN
INTRAMUSCULAR | Status: AC
  Filled 2014-10-17: qty 1

## 2014-10-17 MED ORDER — SIMETHICONE 80 MG PO CHEW
80.0000 mg | CHEWABLE_TABLET | Freq: Three times a day (TID) | ORAL | Status: DC
  Administered 2014-10-18 – 2014-10-20 (×6): 80 mg via ORAL
  Filled 2014-10-17 (×6): qty 1

## 2014-10-17 MED ORDER — FENTANYL CITRATE (PF) 100 MCG/2ML IJ SOLN
INTRAMUSCULAR | Status: AC
  Filled 2014-10-17: qty 4

## 2014-10-17 MED ORDER — DIPHENHYDRAMINE HCL 25 MG PO CAPS
25.0000 mg | ORAL_CAPSULE | ORAL | Status: DC | PRN
  Administered 2014-10-17 – 2014-10-18 (×2): 25 mg via ORAL
  Filled 2014-10-17 (×2): qty 1

## 2014-10-17 MED ORDER — PROMETHAZINE HCL 25 MG/ML IJ SOLN: 6.2500 mg | INTRAMUSCULAR | Status: DC | PRN

## 2014-10-17 MED ORDER — OXYCODONE-ACETAMINOPHEN 5-325 MG PO TABS
1.0000 | ORAL_TABLET | ORAL | Status: DC | PRN
  Administered 2014-10-19 – 2014-10-20 (×2): 1 via ORAL
  Filled 2014-10-17 (×2): qty 1

## 2014-10-17 MED ORDER — OXYTOCIN 40 UNITS IN LACTATED RINGERS INFUSION - SIMPLE MED: 62.5000 mL/h | INTRAVENOUS | Status: AC

## 2014-10-17 MED ORDER — MORPHINE SULFATE (PF) 0.5 MG/ML IJ SOLN
INTRAMUSCULAR | Status: DC | PRN
  Administered 2014-10-17: .2 mg via INTRATHECAL

## 2014-10-17 MED ORDER — OXYTOCIN 10 UNIT/ML IJ SOLN
40.0000 [IU] | INTRAVENOUS | Status: DC | PRN
  Administered 2014-10-17: 40 [IU] via INTRAVENOUS

## 2014-10-17 MED ORDER — KETOROLAC TROMETHAMINE 30 MG/ML IJ SOLN
30.0000 mg | Freq: Once | INTRAMUSCULAR | Status: AC
  Administered 2014-10-17: 30 mg via INTRAVENOUS

## 2014-10-17 MED ORDER — ACETAMINOPHEN 500 MG PO TABS
1000.0000 mg | ORAL_TABLET | Freq: Four times a day (QID) | ORAL | Status: DC
  Administered 2014-10-18 (×2): 1000 mg via ORAL
  Filled 2014-10-17 (×2): qty 2

## 2014-10-17 MED ORDER — LACTATED RINGERS IV SOLN
INTRAVENOUS | Status: DC | PRN
  Administered 2014-10-17: 18:00:00 via INTRAVENOUS

## 2014-10-17 MED ORDER — NALBUPHINE HCL 10 MG/ML IJ SOLN: 5.0000 mg | Freq: Once | INTRAMUSCULAR | Status: DC | PRN

## 2014-10-17 MED ORDER — SIMETHICONE 80 MG PO CHEW: 80.0000 mg | CHEWABLE_TABLET | ORAL | Status: DC | PRN

## 2014-10-17 MED ORDER — ALBUTEROL SULFATE (2.5 MG/3ML) 0.083% IN NEBU: 3.0000 mL | INHALATION_SOLUTION | Freq: Four times a day (QID) | RESPIRATORY_TRACT | Status: DC | PRN

## 2014-10-17 MED ORDER — TETANUS-DIPHTH-ACELL PERTUSSIS 5-2.5-18.5 LF-MCG/0.5 IM SUSP
0.5000 mL | Freq: Once | INTRAMUSCULAR | Status: AC
  Administered 2014-10-18: 0.5 mL via INTRAMUSCULAR
  Filled 2014-10-17: qty 0.5

## 2014-10-17 MED ORDER — ZOLPIDEM TARTRATE 5 MG PO TABS: 5.0000 mg | ORAL_TABLET | Freq: Every evening | ORAL | Status: DC | PRN

## 2014-10-17 MED ORDER — PRENATAL MULTIVITAMIN CH
1.0000 | ORAL_TABLET | Freq: Every day | ORAL | Status: DC
  Administered 2014-10-19 – 2014-10-20 (×2): 1 via ORAL
  Filled 2014-10-17 (×2): qty 1

## 2014-10-17 MED ORDER — SIMETHICONE 80 MG PO CHEW
80.0000 mg | CHEWABLE_TABLET | ORAL | Status: DC
  Administered 2014-10-18 – 2014-10-20 (×3): 80 mg via ORAL
  Filled 2014-10-17 (×3): qty 1

## 2014-10-17 MED ORDER — ONDANSETRON HCL 4 MG/2ML IJ SOLN
INTRAMUSCULAR | Status: DC | PRN
  Administered 2014-10-17: 4 mg via INTRAVENOUS

## 2014-10-17 MED ORDER — ONDANSETRON HCL 4 MG/2ML IJ SOLN
INTRAMUSCULAR | Status: AC
  Filled 2014-10-17: qty 2

## 2014-10-17 MED ORDER — DIPHENHYDRAMINE HCL 25 MG PO CAPS: 25.0000 mg | ORAL_CAPSULE | Freq: Four times a day (QID) | ORAL | Status: DC | PRN

## 2014-10-17 MED ORDER — OXYCODONE-ACETAMINOPHEN 5-325 MG PO TABS
2.0000 | ORAL_TABLET | ORAL | Status: DC | PRN
  Administered 2014-10-19 – 2014-10-20 (×4): 2 via ORAL
  Filled 2014-10-17 (×4): qty 2

## 2014-10-17 MED ORDER — LACTATED RINGERS IV SOLN
INTRAVENOUS | Status: DC
  Administered 2014-10-18 – 2014-10-19 (×3): via INTRAVENOUS

## 2014-10-17 SURGICAL SUPPLY — 37 items
BENZOIN TINCTURE PRP APPL 2/3 (GAUZE/BANDAGES/DRESSINGS) ×3 IMPLANT
CLAMP CORD UMBIL (MISCELLANEOUS) IMPLANT
CLOSURE WOUND 1/2 X4 (GAUZE/BANDAGES/DRESSINGS) ×1
CLOTH BEACON ORANGE TIMEOUT ST (SAFETY) ×3 IMPLANT
DRAPE SHEET LG 3/4 BI-LAMINATE (DRAPES) IMPLANT
DRSG OPSITE POSTOP 4X10 (GAUZE/BANDAGES/DRESSINGS) ×3 IMPLANT
DURAPREP 26ML APPLICATOR (WOUND CARE) ×3 IMPLANT
ELECT REM PT RETURN 9FT ADLT (ELECTROSURGICAL) ×3
ELECTRODE REM PT RTRN 9FT ADLT (ELECTROSURGICAL) ×1 IMPLANT
EXTRACTOR VACUUM BELL STYLE (SUCTIONS) ×3 IMPLANT
EXTRACTOR VACUUM M CUP 4 TUBE (SUCTIONS) IMPLANT
EXTRACTOR VACUUM M CUP 4' TUBE (SUCTIONS)
GLOVE BIO SURGEON STRL SZ8.5 (GLOVE) ×3 IMPLANT
GOWN STRL REUS W/TWL 2XL LVL3 (GOWN DISPOSABLE) ×3 IMPLANT
GOWN STRL REUS W/TWL LRG LVL3 (GOWN DISPOSABLE) ×3 IMPLANT
KIT ABG SYR 3ML LUER SLIP (SYRINGE) IMPLANT
NEEDLE HYPO 25X5/8 SAFETYGLIDE (NEEDLE) IMPLANT
NS IRRIG 1000ML POUR BTL (IV SOLUTION) ×3 IMPLANT
PACK C SECTION WH (CUSTOM PROCEDURE TRAY) ×3 IMPLANT
PAD OB MATERNITY 4.3X12.25 (PERSONAL CARE ITEMS) ×3 IMPLANT
PENCIL SMOKE EVAC W/HOLSTER (ELECTROSURGICAL) ×3 IMPLANT
RETRACTOR TRAXI PANNICULUS (MISCELLANEOUS) ×1 IMPLANT
STRIP CLOSURE SKIN 1/2X4 (GAUZE/BANDAGES/DRESSINGS) ×2 IMPLANT
SUT CHROMIC 0 CT 802H (SUTURE) ×3 IMPLANT
SUT CHROMIC 0 MO4 CR (SUTURE) IMPLANT
SUT CHROMIC 1 CTX 36 (SUTURE) ×6 IMPLANT
SUT CHROMIC 2 0 SH (SUTURE) ×3 IMPLANT
SUT GUT PLAIN 0 CT-3 TAN 27 (SUTURE) IMPLANT
SUT MON AB 4-0 PS1 27 (SUTURE) ×3 IMPLANT
SUT PDS AB 0 CTX 36 PDP370T (SUTURE) IMPLANT
SUT VIC AB 0 CT1 18XCR BRD8 (SUTURE) IMPLANT
SUT VIC AB 0 CT1 8-18 (SUTURE)
SUT VIC AB 0 CTX 36 (SUTURE) ×4
SUT VIC AB 0 CTX36XBRD ANBCTRL (SUTURE) ×2 IMPLANT
TOWEL OR 17X24 6PK STRL BLUE (TOWEL DISPOSABLE) ×3 IMPLANT
TRAXI PANNICULUS RETRACTOR (MISCELLANEOUS) ×2
TRAY FOLEY CATH SILVER 14FR (SET/KITS/TRAYS/PACK) ×3 IMPLANT

## 2014-10-17 NOTE — Progress Notes (Signed)
Patient ID: Madison Weiss, female   DOB: 09/11/1990, 24 y.o.   MRN: [id number] When getting her nonstress test today fetal heart 170 she was hydrated with no change the patient also had occasional contractions and with each contraction she had a late deceleration cervix is long closed vertex -3 station MFM consulted and it was decided that we should go ahead and deliver the patient by C-section because she more than likely will not tolerate labor

## 2014-10-17 NOTE — Op Note (Signed)
Preop diagnosis mild preeclampsia 36 weeks and 6 days of nonreassuring fetal heart rate tracing Postop diagnosis is same Surgeon Dr. Francoise Ceo First assistant Dr. Coral Ceo Anesthesia spinal Procedure patient placed on the operating table in the supine position after the spinal administered abdomen prepped and draped bladder emptied with a Foley catheter a transverse suprapubic incision made carried  Down  to the rectus fascia fascia cleaned and incised length of the incision recti muscles retracted laterally peritoneum incised  longtudianally  transverse incision made on the visceral peritoneum above the bladder and the bladder mobilized inferiorly transverse low uterine incision made the fluid was clear and had a female Apgar 77 and 8  nichal l cord present the placenta was posterior fundal removed manually and sent to pathology the uterine cavity clean with dry laps the uterine incision closed in one layer with continuous suture of #1 chromic bladder flap reattached with 2-0 chromic hemostasis satisfactory lap and sponge counts correct abdomen closed in layers peritoneum continuous with of 0 chromic fascia continuous  0 dexon  and the skin close d subcuticular stitch of 4-0 Monocryl blood loss was 800 cc patient tolerated the procedure well

## 2014-10-17 NOTE — Anesthesia Preprocedure Evaluation (Addendum)
Anesthesia Evaluation  Patient identified by MRN, date of birth, ID band Patient awake    Reviewed: Allergy & Precautions, NPO status , Patient's Chart, lab work & pertinent test results  Airway Mallampati: III  TM Distance: >3 FB Neck ROM: Full    Dental  (+) Teeth Intact, Dental Advisory Given   Pulmonary asthma ,    Pulmonary exam normal breath sounds clear to auscultation       Cardiovascular hypertension, Normal cardiovascular exam Rhythm:Regular Rate:Normal     Neuro/Psych PSYCHIATRIC DISORDERS Anxiety Depression negative neurological ROS     GI/Hepatic negative GI ROS, Neg liver ROS,   Endo/Other  Morbid obesity  Renal/GU negative Renal ROS     Musculoskeletal negative musculoskeletal ROS (+)   Abdominal   Peds  Hematology  (+) Blood dyscrasia, anemia ,   Anesthesia Other Findings Day of surgery medications reviewed with the patient.  Reproductive/Obstetrics (+) Pregnancy                            Anesthesia Physical Anesthesia Plan  ASA: III  Anesthesia Plan: Spinal   Post-op Pain Management:    Induction:   Airway Management Planned:   Additional Equipment:   Intra-op Plan:   Post-operative Plan:   Informed Consent: I have reviewed the patients History and Physical, chart, labs and discussed the procedure including the risks, benefits and alternatives for the proposed anesthesia with the patient or authorized representative who has indicated his/her understanding and acceptance.   Dental advisory given  Plan Discussed with: CRNA, Anesthesiologist and Surgeon  Anesthesia Plan Comments: (Discussed risks and benefits of and differences between spinal and general. Discussed risks of spinal including headache, backache, failure, bleeding, infection, and nerve damage. Patient consents to spinal. Questions answered. Coagulation studies and platelet count acceptable.)         Anesthesia Quick Evaluation

## 2014-10-17 NOTE — Progress Notes (Signed)
Dr Desmond Lope at bedside speaking with pt about plan of care

## 2014-10-17 NOTE — Transfer of Care (Signed)
Immediate Anesthesia Transfer of Care Note  Patient: Madison Weiss  Procedure(s) Performed: Procedure(s): CESAREAN SECTION (N/A)  Patient Location: PACU  Anesthesia Type:Spinal  Level of Consciousness: awake, alert  and oriented  Airway & Oxygen Therapy: Patient Spontanous Breathing  Post-op Assessment: Report given to RN and Post -op Vital signs reviewed and stable  Post vital signs: Reviewed and stable  Last Vitals:  Filed Vitals:   10/17/14 1159  BP: 132/71  Pulse: 116  Temp:   Resp:     Complications: No apparent anesthesia complications

## 2014-10-17 NOTE — Progress Notes (Signed)
Patient ID: Madison Weiss, female   DOB: 10-25-1990, 24 y.o.   MRN: [id number] Eyes blood pressure 150/98 present blood pressure 130/60 respiration 20 she is to have a CBC and a cemented this morning and induction is scheduled for tomorrow morning

## 2014-10-17 NOTE — Anesthesia Postprocedure Evaluation (Signed)
  Anesthesia Post-op Note  Patient: Madison Weiss  Procedure(s) Performed: Procedure(s) (LRB): CESAREAN SECTION (N/A)  Patient Location: PACU  Anesthesia Type: Spinal  Level of Consciousness: awake and alert   Airway and Oxygen Therapy: Patient Spontanous Breathing  Post-op Pain: mild  Post-op Assessment: Post-op Vital signs reviewed, Patient's Cardiovascular Status Stable, Respiratory Function Stable, Patent Airway and No signs of Nausea or vomiting  Last Vitals:  Filed Vitals:   10/17/14 2054  BP: 122/93  Pulse: 95  Temp: 36.4 C  Resp:     Post-op Vital Signs: stable   Complications: No apparent anesthesia complications

## 2014-10-17 NOTE — Anesthesia Procedure Notes (Signed)
Spinal Patient location during procedure: OR Start time: 10/17/2014 5:15 PM End time: 10/17/2014 5:18 PM Staffing Anesthesiologist: Cecile Hearing Performed by: anesthesiologist  Preanesthetic Checklist Completed: patient identified, surgical consent, pre-op evaluation, timeout performed, IV checked, risks and benefits discussed and monitors and equipment checked Spinal Block Patient position: sitting Prep: site prepped and draped and DuraPrep Patient monitoring: continuous pulse ox and blood pressure Approach: midline Location: L3-4 Needle Needle type: Pencan  Needle gauge: 24 G Needle length: 9 cm Additional Notes Functioning IV was confirmed and monitors were applied. Sterile prep and drape, including hand hygiene, mask and sterile gloves were used. The patient was positioned and the spine was prepped. The skin was anesthetized with lidocaine.  Free flow of clear CSF was obtained prior to injecting local anesthetic into the CSF.  The spinal needle aspirated freely following injection.  The needle was carefully withdrawn.  The patient tolerated the procedure well. Consent was obtained prior to procedure with all questions answered and concerns addressed. Risks including but not limited to bleeding, infection, nerve damage, paralysis, failed block, inadequate analgesia, allergic reaction, high spinal, itching and headache were discussed and the patient wished to proceed.   Madison Aran, MD

## 2014-10-18 ENCOUNTER — Encounter (HOSPITAL_COMMUNITY): Payer: Self-pay | Admitting: *Deleted

## 2014-10-18 LAB — CBC
HCT: 28.5 % — ABNORMAL LOW (ref 36.0–46.0)
Hemoglobin: 8.9 g/dL — ABNORMAL LOW (ref 12.0–15.0)
MCH: 23.1 pg — AB (ref 26.0–34.0)
MCHC: 31.2 g/dL (ref 30.0–36.0)
MCV: 74 fL — ABNORMAL LOW (ref 78.0–100.0)
PLATELETS: 257 10*3/uL (ref 150–400)
RBC: 3.85 MIL/uL — AB (ref 3.87–5.11)
RDW: 16 % — ABNORMAL HIGH (ref 11.5–15.5)
WBC: 14.8 10*3/uL — ABNORMAL HIGH (ref 4.0–10.5)

## 2014-10-18 MED ORDER — LABETALOL HCL 100 MG PO TABS
200.0000 mg | ORAL_TABLET | Freq: Three times a day (TID) | ORAL | Status: DC
  Filled 2014-10-18: qty 2

## 2014-10-18 MED ORDER — MAGNESIUM SULFATE 50 % IJ SOLN
2.0000 g/h | INTRAVENOUS | Status: AC
  Administered 2014-10-19: 2 g/h via INTRAVENOUS
  Filled 2014-10-18 (×2): qty 80

## 2014-10-18 MED ORDER — LABETALOL HCL 5 MG/ML IV SOLN
40.0000 mg | Freq: Once | INTRAVENOUS | Status: DC
  Filled 2014-10-18: qty 8

## 2014-10-18 MED ORDER — LABETALOL HCL 5 MG/ML IV SOLN: 40.0000 mg | Freq: Once | INTRAVENOUS | Status: DC | PRN

## 2014-10-18 MED ORDER — MAGNESIUM SULFATE BOLUS VIA INFUSION
4.0000 g | Freq: Once | INTRAVENOUS | Status: AC
  Administered 2014-10-18: 4 g via INTRAVENOUS
  Filled 2014-10-18: qty 500

## 2014-10-18 NOTE — Progress Notes (Signed)
Pt very sleepy and c/o feeling slightly dizzy, declined to do orthostatic VS at this time. Will attempt to do in the A.M.- pt agrees to do then.

## 2014-10-18 NOTE — Progress Notes (Signed)
MD made aware of VS when admitted to antenatal, orders d/c'd for the Labetalol IV and PO at this time. Continue the prn order for Labetalol 40 mg iv prn BP in severe range.

## 2014-10-18 NOTE — Progress Notes (Signed)
Patient ID: Madison Weiss, female   DOB: April 12, 1990, 24 y.o.   MRN: [id number] Postop day 1 Blood pressure 145 04/27/2001 respiration 18 pulse 104 afebrile Highest blood pressure last night was 1 7101 103 and orders had been given that if her blood pressure exceeded 168 hours to be contacted and this was not done abdomen soft lochia moderate 2+ edema We'll start her on labetalol 200 every 8 hours as of this morning

## 2014-10-18 NOTE — Addendum Note (Signed)
Addendum  created 10/18/14 1022 by Shanon Payor, CRNA   Modules edited: Notes Section   Notes Section:  File: [id number]

## 2014-10-18 NOTE — Anesthesia Postprocedure Evaluation (Signed)
  Anesthesia Post-op Note  Patient: Madison Weiss  Procedure(s) Performed: Procedure(s): CESAREAN SECTION (N/A)  Patient Location: Antenatal  Anesthesia Type:Spinal  Level of Consciousness: awake, alert  and oriented  Airway and Oxygen Therapy: Patient Spontanous Breathing  Post-op Pain: none  Post-op Assessment: Post-op Vital signs reviewed, Patient's Cardiovascular Status Stable, Respiratory Function Stable, Patent Airway, No signs of Nausea or vomiting, Adequate PO intake, Pain level controlled, No headache and No backache LLE Motor Response: Responds to commands LLE Sensation: Increased RLE Motor Response: Purposeful movement, Responds to commands RLE Sensation: Full sensation      Post-op Vital Signs: Reviewed and stable  Last Vitals:  Filed Vitals:   10/18/14 0911  BP: 111/56  Pulse: 93  Temp:   Resp: 18    Complications: No apparent anesthesia complications

## 2014-10-18 NOTE — Progress Notes (Signed)
Patient ID: Madison Weiss, female   DOB: Jun 15, 1990, 24 y.o.   MRN: [id number] Postop day 1 addendum the blood pressure 103 highest and now 145 103 Patient is to receive magnesium sulfate 4 g loading and 2 g per hour and transferred to ICU also labetalol 40 mg IV now then to be started on labetalol 200 mg by mouth every 8 hours

## 2014-10-18 NOTE — Lactation Note (Signed)
This note was copied from the chart of Madison Dann Ventress. Lactation Consultation Note  Patient Name: Madison Weiss [id number] Date: 10/18/2014 Reason for consult: Initial assessment  Baby 18 hours old. Mom states that baby isn't opening her mouth wide at all, so she is not able to achieve a good latch. Assisted mom to get into a more upright position and to position pillows at her side and behind her back. Mom has large pendulous breast and nipples have short shafts. Placed 2 rolled up wash cloths under mom's breast for support and to lift breast up to baby. This LC offered a gloved finger to start baby suckling. Baby keeps sucking her tongue, so enc parents to let baby to suckle their finger in between breast feedings to attempt to deter baby from tongue-sucking. Demonstrated to mom how to tickle baby's lips with her nipple and baby did open wide, latch deeply, and begin suckling rhythmically with a few swallows noted. Baby nursed off-and-on for 15 minutes while LC at bedside, and mom able to latch baby herself without assistance from this LC. Demonstrated to mom how to stimulate baby to keep nursing. Discussed with mom that since baby had just had her bath, she was probably tired, and therefore needed to be stimulated to keep suckling. Enc mom to keep offering lots of STS and attempts at nursing, and suck training in between feedings.  Mom given [facility] brochure, aware of OP/BFSG, community resources, and [facility] phone line assistance after D/C. Discussed assessment and interventions with patient's RN, Pam. Maternal Data Has patient been taught Hand Expression?: Yes Does the patient have breastfeeding experience prior to this delivery?: No  Feeding Feeding Type: Breast Fed Length of feed:  (LC assessed first 15 minutes of BF. )  LATCH Score/Interventions Latch: Repeated attempts needed to sustain latch, nipple held in mouth throughout feeding, stimulation needed to elicit sucking  reflex. Intervention(s): Adjust position;Assist with latch;Breast compression  Audible Swallowing: A few with stimulation Intervention(s): Skin to skin;Hand expression Intervention(s): Skin to skin;Hand expression  Type of Nipple: Everted at rest and after stimulation (short shaft.)  Comfort (Breast/Nipple): Soft / non-tender     Hold (Positioning): Assistance needed to correctly position infant at breast and maintain latch. Intervention(s): Breastfeeding basics reviewed;Support Pillows;Position options;Skin to skin  LATCH Score: 7  Lactation Tools Discussed/Used     Consult Status Consult Status: Follow-up Date: 10/19/14 Follow-up type: In-patient    Geralynn Ochs 10/18/2014, 11:58 AM

## 2014-10-19 NOTE — Plan of Care (Signed)
Problem: Phase I Progression Outcomes Goal: OOB as tolerated unless otherwise ordered Outcome: Progressing Education provided to patient on importance of continuing to get up out of bed and move. Patient strongly encouraged to get up and to stay ahead of pain. Patient verbalizes understanding.

## 2014-10-19 NOTE — Progress Notes (Signed)
Patient ID: Madison Weiss, female   DOB: [birth year], 24 y.o.   MRN: [id number] Postop day 2 blood pressure 116/69 respiration 22 and patient's afebrile Her blood pressures are all normal yesterday and she did not receive any labetalol because the repeat blood pressure reading after the 171 was normal however she was started on magnesium sulfate and over the past 24 hours all her blood pressures are normal magnesium was discontinued this morning her outputs good and she has no complaints at this time postop hemoglobin was 8.7

## 2014-10-19 NOTE — Progress Notes (Signed)
Orthostatic BP's elevated when taken this AM at 0620, awaited for Dr. Elsie Stain arrival to notify of this. Dr. Gaynell Face on unit at 0700, notified of elevated BP's this AM when orthostatics done, also notified all other BP's throughout night were WNL. New orders received and noted. Dr. Gaynell Face then requested additional orders and transfer of pt to AICU for Mag Sulfate administration. House supervisor called and notified of order to transfer, pt to be transferred to RM 159 in Antenatal. Pt transferred via wheelchair to Antenatal rm, accompanied by infant and significant other. Report given to Rinaldo Cloud, Charity fundraiser. Pt left in stable condition and w/out incident.

## 2014-10-19 NOTE — Plan of Care (Signed)
Problem: Phase I Progression Outcomes Goal: VS, stable, temp < 100.4 degrees F Outcome: Progressing Patient educated on signs and symptoms of preeclampsia and side effects of Magnesium. Reviewed the process for taking accurate BP measurements, using correct bp cuff, etc. Patient verbalizes understanding.

## 2014-10-19 NOTE — Lactation Note (Signed)
This note was copied from the chart of Madison Ishani Goldwasser. Lactation Consultation Note  Mother left tubing for breastpump upstairs and they were thrown away. Provided mother w/ tubing and offered to help w/ breastfeeding. Mother states breastfeeding is going well and she wants to take a shower. Briefly discussed cluster feeding and Pacifier use not recommended at this time.    Patient Name: Madison Weiss [id number] Date: 10/19/2014 Reason for consult: Follow-up assessment   Maternal Data    Feeding Feeding Type: Breast Fed  LATCH Score/Interventions                      Lactation Tools Discussed/Used     Consult Status Consult Status: Follow-up Date: 10/20/14 Follow-up type: In-patient    Dahlia Byes [facility] 10/19/2014, 9:39 PM

## 2014-10-19 NOTE — Clinical Social Work Maternal (Signed)
CLINICAL SOCIAL WORK MATERNAL/CHILD NOTE  Patient Details  Name: Madison Weiss MRN: [id number] Date of Birth: 11/20/1990  Date:  10/19/2014  Clinical Social Worker Initiating Note:  Sarah Venning, LCSW Date/ Time Initiated:  10/19/14/0915     Child's Name:  Madison Weiss   Legal Guardian:  Madison Weiss and Madison Weiss  Need for Interpreter:  None   Date of Referral:  10/17/14     Reason for Referral: History of anxiety and depression  Referral Source:  Central Nursery   Address:  [address] [place], [place] [address]  Phone number:  [phone]   Household Members:  Significant Other   Natural Supports (not living in the home):  Immediate Family, Extended Family, Friends   Professional Supports: None   Employment: Student   Type of Work:     Education:  Attending college: Studying recreational therapy at [employer]   Financial Resources:  Medicaid   Other Resources:  WIC   Cultural/Religious Considerations Which May Impact Care:  None reported  Strengths:  Ability to meet basic needs , Home prepared for child , Pediatrician chosen    Risk Factors/Current Problems:   1)Mental Health Concerns: MOB presents with history of anxiety and depression since childhood. During the pregnancy, MOB endorsed presence of anxiety.  MOB continues to cope and adjust to unanticipated complications during the pregnancy and early induction.  Cognitive State:  Able to Concentrate , Alert , Goal Oriented , Linear Thinking , Insightful    Mood/Affect:  Happy , Interested , Bright , Comfortable    CSW Assessment:  CSW received request for consult due to MOB presenting with a history of anxiety and depression.  MOB presented as easily engaged and receptive to the visit. She displayed a full range in affect and presented in a pleasant mood.  FOB was in the room when CSW arrived, but he left prior to assessment initiated as he went to find food for himself.  MOB was holding  and caring for the infant during the end of the assessment, no acute mental health symptoms observed, but she does present with anxious thought process.  CSW assisted the MOB to process her thoughts and feelings related to high risk pregnancy, admission prior to infant's birth, and ongoing complications secondary to pre-eclampsia.  MOB openly discussed her feelings of sadness and frustration since it was unanticipated and undesired.  CSW normalized her feelings, and continued to assist her to process how she feels.  The MOB discussed her feelings when she learned that she would have to miss her baby shower, and the fear and worry she felt when she the induction occurred earlier than anticipated.  MOB continues to report that she is coping with her C-section, the physical pain, and her perceptions of a slow recovery.  She discussed how she has been talking to her mother for support, praying every night, and attempting to recognize that these stressors are short term. She shared that it is not always easy to remain positive, but is reporting efforts to remain hopeful that she will be home soon.  She endorsed "loving" and feeling "excited" to becoming a mother. MOB endorsed a strong support system that will assist her with the transition home and to the postpartum period. She stated that her parents live in [place], but she has "godmothers" and other friends who she believes will be able to support her when the FOB is not home since he works and attends college.    MOB openly discussed   additional thoughts and feelings related to unanticipated plans in her education. She stated that she is attending [employer] and pursuing a degree in recreation therapy. 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 She voiced  frustration since she wants to be finished with school.   Despite frustrations with the change in plans, she was receptive to exploring potential gains of having the disruption. She recognized that she will now have an opportunity to have two internships that will provide her with increased exposure to professional experiences.  MOB expressed high levels of motivation to complete her internship in January, and shared belief that despite feeling impatient at times, she knows that January will "be here before I know it".   Per chart review, MOB presents with history of anxiety and depression since childhood. MOB confirmed history during CSW assessment, and stated that she previously has been prescribed Zoloft. She stated that medications were discontinued after high school, and she has been attempting to developing coping skills and relaxation techniques.  MOB shared that she has attempted to "remain calm" during the pregnancy since she knows that anxiety may have a negative impact on the baby, and she expressed belief that she was successful in reducing her anxiety.  Upon further exploration, MOB verbalized numerous symptoms of anxiety during the pregnancy.  MOB stated that she experiences racing thoughts, and frequently asks herself "what if". She stated that she worries what may happen if she leaves the home, reported that she fears that something negative will happen if she is not watching the infant while she is sleeping, and she is anxious about being left alone with the infant. MOB discussed feeling restless and "on edge" due to the anxiety.  She recognized how anxiety has negatively impacted her life, and shared belief that her life would be "better" if she no longer felt anxious.  MOB stated that she has a professor who has previously recommended therapy/counseling, but she never initiated contact.  MOB expressed interest in starting therapy postpartum as she transitions to motherhood and attempts to finish  her degree.  CSW reviewed with MOB how to initiate therapy at the college counseling center. MOB also expressed interest in speaking to her OB prior to discharge to discuss potential medications.  MOB denied any feelings of depression, and only identified with symptoms of anxiety during the pregnancy and as she prepares to be discharged home.  CSW provided education on the Baby Blues and perinatal mood and anxiety disorders. MOB presented as attentive and engaged during the education, and asked follow up questions about symptoms.   MOB reported that she felt "better" after talking with CSW. She stated that she finds it beneficial to express herself and to process her feelings.  MOB expressed appreciation, and agreed to contact CSW if additional needs arise during the admission.  CSW Plan/Description:   1)Patient/Family Education  2)Information/Referral to Community Resources: CSW discussed mental health resources available through [employer]. MOB expressed interest in initiating care.  She also expressed interest in discussing medications with her OB prior to discharge.  3)No Further Intervention Required/No Barriers to Discharge    Venning, Sarah N, LCSW 10/19/2014, 10:55 AM  

## 2014-10-20 LAB — RPR: RPR Ser Ql: NONREACTIVE

## 2014-10-20 MED ORDER — MEASLES, MUMPS & RUBELLA VAC ~~LOC~~ INJ
0.5000 mL | INJECTION | Freq: Once | SUBCUTANEOUS | Status: AC
  Administered 2014-10-20: 0.5 mL via SUBCUTANEOUS
  Filled 2014-10-20 (×2): qty 0.5

## 2014-10-20 NOTE — Progress Notes (Signed)
Patient ID: Madison Weiss, female   DOB: 28-Mar-1990, 24 y.o.   MRN: [id number] Postop day 3 Blood pressure 125/92 02/08/1944 respiration 18 pulse 93 afebrile Abdomen soft dressing dry incision clean Legs -1+ edema doing well home today

## 2014-10-20 NOTE — Discharge Instructions (Signed)
Discharge instructions   You can wash your hair  Shower  Eat what you want  Drink what you want  See me in 6 weeks  Your ankles are going to swell more in the next 2 weeks than when pregnant  No sex for 6 weeks   Melven Stockard A, MD 10/20/2014

## 2014-10-20 NOTE — Progress Notes (Signed)
Pt had an emotional outburst, crying stating no one is taking care of her. She wants pain medicine and asking for a nipple shield. LC have seen patient. States feedings are going well, and from my morning observations feedings are going well. Mother was given supportive information and advice about breastfeeding positions and latching on to prevent sore nipples. Pt was given emotional support and discussed with patient again about post partum blues and signs & symptoms of depression. Pain medicine offered to patient for incisional pain rated at 8 and burning. Pt's mother was apparently on the phone with patient upsetting the patient in regards to taking care and feeding her baby. Ria Comment, RN

## 2014-10-20 NOTE — Lactation Note (Signed)
This note was copied from the chart of Madison Weiss. Lactation Consultation Note:  Mother independently latching infant . Infant latched when I arrived in the room in a laid back position with infant on shallow.  Mother states that she is hearing infant swallow. Observed that mothers nipple is biforcated on the left side. Observed pinching of the nipple when infant released the breast. Mother taught proper latch and well as teaching with Dad at the bedside. Mother taught proper hand expression. Observed good flow of colostrum. Infant latched on the alternate breast and sustained latch for several mins. Advised in getting infants mouth wide open for more milk transfer. Mother is active with [facility]. A WIC referral form was sent to [facility] requesting an electric pump. Mother has a hand pump as well. She states that she plans to go to [facility] office on discharge. Mother states that I just don't see how she could be hungry again. Discussed infants need to cluster feed. Teaching with parents to allow infant to nurse frequently. Advised that infant may need additional calories if begins to tire easily.   Patient Name: Madison Weiss Date: 10/20/2014 Reason for consult: Follow-up assessment   Maternal Data    Feeding Feeding Type: Breast Fed Length of feed: 15 min  LATCH Score/Interventions Latch: Grasps breast easily, tongue down, lips flanged, rhythmical sucking. Intervention(s): Skin to skin;Teach feeding cues Intervention(s): Adjust position  Audible Swallowing: A few with stimulation Intervention(s): Hand expression  Type of Nipple: Everted at rest and after stimulation  Comfort (Breast/Nipple): Soft / non-tender     Hold (Positioning): Assistance needed to correctly position infant at breast and maintain latch. Intervention(s): Support Pillows;Position options  LATCH Score: 8  Lactation Tools Discussed/Used     Consult Status      Michel Bickers 10/20/2014, 10:48 AM

## 2014-10-20 NOTE — Discharge Summary (Signed)
Obstetric Discharge Summary Reason for Admission: observation/evaluation Prenatal Procedures: NST Intrapartum Procedures: cesarean: low cervical, transverse Postpartum Procedures: none Complications-Operative and Postpartum: none HEMOGLOBIN  Date Value Ref Range Status  10/18/2014 8.9* 12.0 - 15.0 g/dL Final   HCT  Date Value Ref Range Status  10/18/2014 28.5* 36.0 - 46.0 % Final    Physical Exam:  General: alert Lochia: appropriate Uterine Fundus: firm Incision: healing well DVT Evaluation: No evidence of DVT seen on physical exam.  Discharge Diagnoses: Term Pregnancy-delivered  Discharge Information: Date: 10/20/2014 Activity: pelvic rest Diet: routine Medications: Percocet Condition: stable Instructions: refer to practice specific booklet Discharge to: home Follow-up Information    Follow up with Kathreen Cosier, MD.   Specialty:  Obstetrics and Gynecology   Contact information:   [address] [place] [address] [phone]       Newborn Data: Live born female  Birth Weight: 7 lb 12.5 oz (3530 g) APGAR: 7, 7  Home with mother.  MARSHALL,BERNARD A 10/20/2014, 6:34 AM

## 2015-09-03 ENCOUNTER — Emergency Department (HOSPITAL_COMMUNITY)
Admission: EM | Admit: 2015-09-03 | Discharge: 2015-09-04 | Disposition: A | Discharge status: Home / Self Care | Payer: Medicaid Other | Attending: Emergency Medicine | Admitting: Emergency Medicine

## 2015-09-03 ENCOUNTER — Encounter (HOSPITAL_COMMUNITY): Payer: Self-pay

## 2015-09-03 DIAGNOSIS — J45909 Unspecified asthma, uncomplicated: Secondary | ICD-10-CM | POA: Insufficient documentation

## 2015-09-03 DIAGNOSIS — Z3A01 Less than 8 weeks gestation of pregnancy: Secondary | ICD-10-CM | POA: Insufficient documentation

## 2015-09-03 DIAGNOSIS — O209 Hemorrhage in early pregnancy, unspecified: Secondary | ICD-10-CM | POA: Diagnosis present

## 2015-09-03 DIAGNOSIS — O200 Threatened abortion: Secondary | ICD-10-CM | POA: Insufficient documentation

## 2015-09-03 LAB — CBC WITH DIFFERENTIAL/PLATELET
Basophils Absolute: 0 10*3/uL (ref 0.0–0.1)
Basophils Relative: 0 %
EOS PCT: 2 %
Eosinophils Absolute: 0.2 10*3/uL (ref 0.0–0.7)
HEMATOCRIT: 39.9 % (ref 36.0–46.0)
Hemoglobin: 13 g/dL (ref 12.0–15.0)
LYMPHS ABS: 3.3 10*3/uL (ref 0.7–4.0)
LYMPHS PCT: 28 %
MCH: 25.4 pg — AB (ref 26.0–34.0)
MCHC: 32.6 g/dL (ref 30.0–36.0)
MCV: 77.9 fL — AB (ref 78.0–100.0)
MONO ABS: 0.6 10*3/uL (ref 0.1–1.0)
MONOS PCT: 5 %
NEUTROS ABS: 7.8 10*3/uL — AB (ref 1.7–7.7)
Neutrophils Relative %: 65 %
Platelets: 339 10*3/uL (ref 150–400)
RBC: 5.12 MIL/uL — ABNORMAL HIGH (ref 3.87–5.11)
RDW: 15.8 % — AB (ref 11.5–15.5)
WBC: 11.9 10*3/uL — ABNORMAL HIGH (ref 4.0–10.5)

## 2015-09-03 LAB — HCG, QUANTITATIVE, PREGNANCY: hCG, Beta Chain, Quant, S: 54 m[IU]/mL — ABNORMAL HIGH (ref <5)

## 2015-09-03 MED ORDER — ACETAMINOPHEN 325 MG PO TABS
650.0000 mg | ORAL_TABLET | Freq: Once | ORAL | Status: AC
  Administered 2015-09-03: 650 mg via ORAL
  Filled 2015-09-03: qty 2

## 2015-09-03 NOTE — ED Provider Notes (Signed)
By signing my name below, I, Rosario Adie, attest that this documentation has been prepared under the direction and in the presence of Marlon Pel, PA-C.  Electronically Signed: Rosario Adie, ED Scribe. 09/03/15. 11:20 PM.  TIME SEEN:  First MD Initiated Contact with Patient 09/03/15 2303    CHIEF COMPLAINT:  Chief Complaint  Patient presents with  . Vaginal Bleeding    HPI Comments: Madison Weiss is a 25 y.o. female who is [redacted] weeks pregnant, who presents to the Emergency Department complaining of sudden onset, gradually worsening, intermittent vaginal bleeding x ~3 days. Pt reports associated very mild, period like, abdominal cramping since the onset of her bleeding. She describes the bleeding as bright red and including small clots, and has been using ~3 pads a day since onset. States that her pregnancy was confirmed by blood test at her PCPs office 2 weeks ago. Pt reports that she was scheduled to have an abortion performed ~1 week ago, but the clinic did not perform the procedure because they could not confirm the baby via [place]. Pt notes that she has had 1 abortion in the past, and has had similar abdominal cramping s/p her previous abortion similar to the cramping she is experiencing today. She states that if she was not pregnant that she would have had her menstrual cycle this week. Additionally notes that she has had intermittent mild HAs; states that she has hx of HAs during previous pregnancies. Denies fever, syncope, emesis, or any other associated symptoms.   ROS: See HPI Constitutional: no fever  Eyes: no drainage  ENT: no runny nose   Cardiovascular:  no chest pain  Resp: no SOB  GI: no vomiting GU: no dysuria Integumentary: no rash  Allergy: no hives  Musculoskeletal: no leg swelling  Neurological: no slurred speech ROS otherwise negative  PAST MEDICAL HISTORY/PAST SURGICAL HISTORY:  Past Medical History:  Diagnosis Date  . Anxiety   . Arrhythmia    currently seeing cardiologist  . Asthma    Albuterol  . Chronic back pain    d/t "the size of my boobs"  . Depression   . Hx of chlamydia infection    treated  . Hx of trichomoniasis    treated  . Obesity   . PIH (pregnancy induced hypertension) 10/03/2014   MEDICATIONS:  Prior to Admission medications   Not on File   ALLERGIES:  No Known Allergies  SOCIAL HISTORY:  Social History  Substance Use Topics  . Smoking status: Never Smoker  . Smokeless tobacco: Never Used  . Alcohol use No   FAMILY HISTORY: History reviewed. No pertinent family history.  EXAM: BP 132/96 (BP Location: Right Arm)   Pulse 85   Temp 98 F (36.7 C) (Oral)   Resp 17   Ht  (1.473 m)   Wt 208 lb (94.3 kg)   SpO2 100%   BMI 43.47 kg/m  CONSTITUTIONAL: Alert and oriented and responds appropriately to questions. Well-appearing; well-nourished HEAD: Normocephalic EYES: Conjunctivae clear, PERRL ENT: normal nose; no rhinorrhea; moist mucous membranes NECK: Supple, no meningismus, no LAD  CARD: RRR;  RESP: Normal chest excursion without splinting or tachypnea; breath sounds clear and equal bilaterally; no wheezes, no rhonchi, no rales, no hypoxia or respiratory distress, speaking full sentences ABD/GI: Normal bowel sounds; non-distended; soft, non-tender, no rebound, no distension, no guarding, no peritoneal signs BACK:  The back appears normal and is non-tender to palpation, there is no CVA tenderness EXT: Normal ROM in all  joints; non-tender to palpation; no edema; normal capillary refill; no cyanosis, no calf tenderness or swelling   PELVIC: Chaperone present on exam. Cervix is closed. Blood within the vaginal vault. No clots on exam.  SKIN: Normal color for age and race; warm; no rash NEURO: Moves all extremities equally, sensation to light touch intact diffusely, cranial nerves II through XII intact PSYCH: The patient's mood and manner are appropriate. Grooming and personal hygiene are  appropriate.  MEDICAL DECISION MAKING:  Patient had positive pregnancy test 2 weeks ago at primary care doctors office which was confirmed 1 week ago at abortion clinic by blood work which confirmed 5 weeks. The abortion clinic was unable to perform abortion because they could not see an IUP and therefore planned to have her return in two weeks. Now that her HCG is 50, the patient has likely miscarried given the dates. I advised her to follow-up with her PCP in 2-3 days to have her HCG checked one more time to ensure it has returned to 0. If her bleeding were to worsen or if she were to become symptomatic then I advise her to return to the ED. Normal orthostatics in the ED and normal vital signs.    I personally performed the services described in this documentation, which was scribed in my presence. The recorded information has been reviewed and is accurate.     Marlon Peliffany Wen Munford, PA-C 09/04/15 0020    Layla MawKristen N Ward, DO 09/04/15 [id number]

## 2015-09-03 NOTE — ED Triage Notes (Signed)
Pt states [redacted] weeks pregnant. Pt states vaginal bleeding since Saturday. States some small clots, ~ 3 pads/day.

## 2016-04-26 ENCOUNTER — Emergency Department (HOSPITAL_COMMUNITY)
Admission: EM | Admit: 2016-04-26 | Discharge: 2016-04-26 | Disposition: A | Discharge status: Home / Self Care | Payer: Medicaid Other | Attending: Emergency Medicine | Admitting: Emergency Medicine

## 2016-04-26 ENCOUNTER — Emergency Department (HOSPITAL_COMMUNITY): Payer: Medicaid Other

## 2016-04-26 ENCOUNTER — Encounter (HOSPITAL_COMMUNITY): Payer: Self-pay

## 2016-04-26 DIAGNOSIS — Y929 Unspecified place or not applicable: Secondary | ICD-10-CM | POA: Diagnosis not present

## 2016-04-26 DIAGNOSIS — J45909 Unspecified asthma, uncomplicated: Secondary | ICD-10-CM | POA: Insufficient documentation

## 2016-04-26 DIAGNOSIS — M25511 Pain in right shoulder: Secondary | ICD-10-CM | POA: Diagnosis present

## 2016-04-26 DIAGNOSIS — W109XXA Fall (on) (from) unspecified stairs and steps, initial encounter: Secondary | ICD-10-CM | POA: Diagnosis not present

## 2016-04-26 DIAGNOSIS — Y9389 Activity, other specified: Secondary | ICD-10-CM | POA: Insufficient documentation

## 2016-04-26 DIAGNOSIS — Y999 Unspecified external cause status: Secondary | ICD-10-CM | POA: Insufficient documentation

## 2016-04-26 MED ORDER — IBUPROFEN 800 MG PO TABS: 800.0000 mg | ORAL_TABLET | Freq: Three times a day (TID) | ORAL | 0 refills | Status: DC

## 2016-04-26 NOTE — ED Notes (Signed)
Pt c/o shoulder pain after falling down the stairs this morning. Pain has not gotten any better. Hurts to pull shoulder backwards.

## 2016-04-26 NOTE — ED Provider Notes (Signed)
[facility]-EMERGENCY DEPT Provider Note   CSN: [id number] Arrival date & time: 04/26/16  0153     History   Chief Complaint Chief Complaint  Patient presents with  . Shoulder Pain    HPI Madison Weiss is a 26 y.o. female.  Patient presents to the emergency department with chief complaint right shoulder pain. She states that she was lifting furniture this morning, and stumbled and fell down several stairs landing on her right shoulder. She states that the pain is worsened when she extends her right arm above her head. It is also worsened with palpation over the shoulder joint. She denies having taken anything for her symptoms. There are no other associated symptoms. She denies any numbness, weakness, or tingling.   The history is provided by the patient. No language interpreter was used.    Past Medical History:  Diagnosis Date  . Anxiety   . Arrhythmia    currently seeing cardiologist  . Asthma    Albuterol  . Chronic back pain    d/t "the size of my boobs"  . Depression   . Hx of chlamydia infection    treated  . Hx of trichomoniasis    treated  . Obesity   . PIH (pregnancy induced hypertension) 10/03/2014    Patient Active Problem List   Diagnosis Date Noted  . S/P cesarean section 10/17/2014  . Pre-eclampsia, antepartum 10/13/2014  . PIH (pregnancy induced hypertension) 10/03/2014  . [redacted] weeks gestation of pregnancy   . Encounter for (NT) nuchal translucency scan     Past Surgical History:  Procedure Laterality Date  . CESAREAN SECTION N/A 10/17/2014   Procedure: CESAREAN SECTION;  Surgeon: Kathreen Cosier, MD;  Location: [facility];  Service: Obstetrics;  Laterality: N/A;  . NO PAST SURGERIES      OB History    Gravida Para Term Preterm AB Living   SAB TAB Ectopic Multiple Live Births         0 1       Home Medications    Prior to Admission medications   Medication Sig Start Date End Date Taking? Authorizing Provider  albuterol (PROVENTIL  HFA;VENTOLIN HFA) 108 (90 Base) MCG/ACT inhaler Inhale into the lungs every 6 (six) hours as needed for wheezing or shortness of breath.    Historical Provider, MD  amLODipine (NORVASC) 5 MG tablet Take 5 mg by mouth daily.    Historical Provider, MD  escitalopram (LEXAPRO) 10 MG tablet Take 10 mg by mouth daily.    Historical Provider, MD    Family History No family history on file.  Social History Social History  Substance Use Topics  . Smoking status: Never Smoker  . Smokeless tobacco: Never Used  . Alcohol use No     Allergies   Patient has no known allergies.   Review of Systems Review of Systems  All other systems reviewed and are negative.    Physical Exam Updated Vital Signs BP 132/82 (BP Location: Left Arm)   Pulse 86   Temp 97.7 F (36.5 C) (Oral)   Resp 18   SpO2 100%   Physical Exam Nursing note and vitals reviewed.  Constitutional: Pt appears well-developed and well-nourished. No distress.  HENT:  Head: Normocephalic and atraumatic.  Eyes: Conjunctivae are normal.  Neck: Normal range of motion.  Cardiovascular: Normal rate, regular rhythm. Intact distal pulses.   Capillary refill < 3 sec.  Pulmonary/Chest: Effort normal and  breath sounds normal.  Musculoskeletal:  Right shoulder tender to palpation, but without bony abnormality or deformity   ROM: 4/5  Strength: 4/5 limited by pain  Neurological: Pt  is alert. Coordination normal.  Sensation: 5/5 Skin: Skin is warm and dry. Pt is not diaphoretic.  No evidence of open wound or skin tenting Psychiatric: Pt has a normal mood and affect.     ED Treatments / Results  Labs (all labs ordered are listed, but only abnormal results are displayed) Labs Reviewed - No data to display  EKG  EKG Interpretation None       Radiology Dg Shoulder Right  Result Date: 04/26/2016 CLINICAL DATA:  Status post fall down stairs, with right shoulder pain Initial encounter. EXAM: RIGHT SHOULDER - 2+ VIEW  COMPARISON:  None. FINDINGS: There is no evidence of fracture or dislocation. An os acromiale is noted, with mild associated degenerative change. The right humeral head is seated within the glenoid fossa. The acromioclavicular joint is unremarkable in appearance. No significant soft tissue abnormalities are seen. The visualized portions of the right lung are clear. IMPRESSION: 1. No evidence of fracture or dislocation. 2. Os acromiale, with mild associated degenerative change. Electronically Signed   By: Roanna Raider M.D.   On: 04/26/2016 03:43    Procedures Procedures (including critical care time)  Medications Ordered in ED Medications - No data to display   Initial Impression / Assessment and Plan / ED Course  I have reviewed the triage vital signs and the nursing notes.  Pertinent labs & imaging results that were available during my care of the patient were reviewed by me and considered in my medical decision making (see chart for details).     Patient X-Ray negative for obvious fracture or dislocation.  Pt advised to follow up with orthopedics. Patient given sling for comfort while in ED, conservative therapy recommended and discussed. Patient will be discharged home & is agreeable with above plan. Returns precautions discussed. Pt appears safe for discharge.   Final Clinical Impressions(s) / ED Diagnoses   Final diagnoses:  Acute pain of right shoulder    New Prescriptions New Prescriptions   IBUPROFEN (ADVIL,MOTRIN) 800 MG TABLET    Take 1 tablet (800 mg total) by mouth 3 (three) times daily.     Roxy Horseman, PA-C 04/26/16 1610    Zadie Rhine, MD 04/27/16 [phone]

## 2016-11-11 ENCOUNTER — Encounter (HOSPITAL_BASED_OUTPATIENT_CLINIC_OR_DEPARTMENT_OTHER): Payer: Self-pay

## 2016-11-11 ENCOUNTER — Emergency Department (HOSPITAL_BASED_OUTPATIENT_CLINIC_OR_DEPARTMENT_OTHER)
Admission: EM | Admit: 2016-11-11 | Discharge: 2016-11-11 | Disposition: A | Discharge status: Home / Self Care | Payer: Medicaid Other | Attending: Emergency Medicine | Admitting: Emergency Medicine

## 2016-11-11 DIAGNOSIS — F419 Anxiety disorder, unspecified: Secondary | ICD-10-CM | POA: Insufficient documentation

## 2016-11-11 DIAGNOSIS — Z202 Contact with and (suspected) exposure to infections with a predominantly sexual mode of transmission: Secondary | ICD-10-CM | POA: Diagnosis not present

## 2016-11-11 DIAGNOSIS — J45909 Unspecified asthma, uncomplicated: Secondary | ICD-10-CM | POA: Insufficient documentation

## 2016-11-11 DIAGNOSIS — F329 Major depressive disorder, single episode, unspecified: Secondary | ICD-10-CM | POA: Insufficient documentation

## 2016-11-11 LAB — URINALYSIS, ROUTINE W REFLEX MICROSCOPIC
Bilirubin Urine: NEGATIVE
GLUCOSE, UA: NEGATIVE mg/dL
HGB URINE DIPSTICK: NEGATIVE
Ketones, ur: NEGATIVE mg/dL
LEUKOCYTES UA: NEGATIVE
Nitrite: NEGATIVE
PH: 7 (ref 5.0–8.0)
PROTEIN: NEGATIVE mg/dL
SPECIFIC GRAVITY, URINE: 1.02 (ref 1.005–1.030)

## 2016-11-11 LAB — WET PREP, GENITAL
CLUE CELLS WET PREP: NONE SEEN
SPERM: NONE SEEN
TRICH WET PREP: NONE SEEN
Yeast Wet Prep HPF POC: NONE SEEN

## 2016-11-11 LAB — PREGNANCY, URINE: Preg Test, Ur: NEGATIVE

## 2016-11-11 MED ORDER — LIDOCAINE HCL 1 % IJ SOLN
INTRAMUSCULAR | Status: AC
  Administered 2016-11-11: 1.2 mL
  Filled 2016-11-11: qty 20

## 2016-11-11 MED ORDER — CEFTRIAXONE SODIUM 250 MG IJ SOLR
250.0000 mg | Freq: Once | INTRAMUSCULAR | Status: AC
  Administered 2016-11-11: 250 mg via INTRAMUSCULAR
  Filled 2016-11-11: qty 250

## 2016-11-11 MED ORDER — AZITHROMYCIN 250 MG PO TABS
1000.0000 mg | ORAL_TABLET | Freq: Once | ORAL | Status: AC
  Administered 2016-11-11: 1000 mg via ORAL
  Filled 2016-11-11: qty 4

## 2016-11-11 MED ORDER — FLUCONAZOLE 150 MG PO TABS: 150.0000 mg | ORAL_TABLET | Freq: Every day | ORAL | 0 refills | Status: DC

## 2016-11-11 NOTE — ED Triage Notes (Signed)
Pt states she was notified by a sexual partner pos for STD-pt NAD-steady gait

## 2016-11-11 NOTE — Discharge Instructions (Signed)
Please read and follow all provided instructions.  Your diagnoses today include:  1. STD exposure    Tests performed today include:  Test for gonorrhea and chlamydia.   Test for HIV and syphilis.   You will be notified by telephone with any positive results.   Vital signs. See below for your results today.   Medications:  You were treated with azithromycin and rocephin today. These antibiotics treat you for gonorrhea and chlamydia. They do not treat for HIV or syphilis.   Home care instructions:  Read educational materials contained in this packet and follow any instructions provided.   You should tell your partners about your infection and avoid having sex for one week to allow time for the medicine to work.  Sexually transmitted disease testing also available at:   [facility], [facility]  [address] [place], phone [phone] or [phone]    Monday - Friday, call for an appointment  [facility] Department of [facility], [facility]  [address], [place], phone [phone] or [phone]   Monday - Friday, call for an appointment  Return instructions:   Please return to the Emergency Department if you experience worsening symptoms.   Please return if you have any other emergent concerns.  Additional Information:  Your vital signs today were: BP (!) 142/97 (BP Location: Left Arm)    Pulse 94    Temp 98.8 F (37.1 C) (Oral)    Resp 18    Ht  (1.473 m)    Wt 88.9 kg (196 lb)    LMP 11/04/2016    SpO2 100%    BMI 40.96 kg/m  If your blood pressure (BP) was elevated above 135/85 this visit, please have this repeated by your doctor within one month. --------------

## 2016-11-11 NOTE — ED Provider Notes (Signed)
[facility] EMERGENCY DEPARTMENT Provider Note   CSN: [id number] Arrival date & time: 11/11/16  1408     History   Chief Complaint Chief Complaint  Patient presents with  . Exposure to STD    HPI Madison Weiss is a 26 y.o. female.  Patient presents with complaint of exposure to chlamydia. Patient states that she was notified by a partner that they had chlamydia. Patient had unprotected sex with this person approximately 2 weeks ago. Patient has had back pains but states that these occur chronically. She denies any vaginal bleeding or discharge. She had an episode of burning with urination several days ago but this has not been persistent. No fevers, nausea or vomiting. No treatments prior to arrival. The onset of this condition was acute. The course is constant. Aggravating factors: none. Alleviating factors: none.        Past Medical History:  Diagnosis Date  . Anxiety   . Arrhythmia    currently seeing cardiologist  . Asthma    Albuterol  . Chronic back pain    d/t "the size of my boobs"  . Depression   . Hx of chlamydia infection    treated  . Hx of trichomoniasis    treated  . Obesity   . PIH (pregnancy induced hypertension) 10/03/2014    Patient Active Problem List   Diagnosis Date Noted  . S/P cesarean section 10/17/2014  . Pre-eclampsia, antepartum 10/13/2014  . PIH (pregnancy induced hypertension) 10/03/2014  . [redacted] weeks gestation of pregnancy   . Encounter for (NT) nuchal translucency scan     Past Surgical History:  Procedure Laterality Date  . CESAREAN SECTION N/A 10/17/2014   Procedure: CESAREAN SECTION;  Surgeon: Kathreen Cosier, MD;  Location: [facility];  Service: Obstetrics;  Laterality: N/A;  . NO PAST SURGERIES      OB History    Gravida Para Term Preterm AB Living   SAB TAB Ectopic Multiple Live Births         0 1       Home Medications    Prior to Admission medications   Medication Sig Start Date End Date  Taking? Authorizing Provider  fluconazole (DIFLUCAN) 150 MG tablet Take 1 tablet (150 mg total) by mouth daily. 11/11/16   Renne Crigler, PA-C    Family History No family history on file.  Social History Social History  Substance Use Topics  . Smoking status: Never Smoker  . Smokeless tobacco: Never Used  . Alcohol use Yes     Comment: occ     Allergies   Patient has no known allergies.   Review of Systems Review of Systems  Constitutional: Negative for fever.  HENT: Negative for sore throat.   Eyes: Negative for discharge.  Gastrointestinal: Negative for rectal pain.  Genitourinary: Positive for dysuria. Negative for frequency, genital sores, pelvic pain, vaginal bleeding and vaginal discharge.  Musculoskeletal: Negative for arthralgias.  Skin: Negative for rash.  Hematological: Negative for adenopathy.     Physical Exam Updated Vital Signs BP (!) 142/97 (BP Location: Left Arm)   Pulse 94   Temp 98.8 F (37.1 C) (Oral)   Resp 18   Ht  (1.473 m)   Wt 88.9 kg (196 lb)   LMP 11/04/2016   SpO2 100%   BMI 40.96 kg/m   Physical Exam  Constitutional: She appears well-developed and well-nourished.  HENT:  Head: Normocephalic and atraumatic.  Eyes: Conjunctivae are normal.  Neck: Normal range of motion. Neck supple.  Pulmonary/Chest: No respiratory distress.  Genitourinary: Uterus normal. Pelvic exam was performed with patient supine. There is no rash or tenderness on the right labia. There is no rash or tenderness on the left labia. Uterus is not tender. Cervix exhibits no motion tenderness and no discharge. Right adnexum displays no mass and no tenderness. Left adnexum displays no mass and no tenderness. No erythema, tenderness or bleeding in the vagina. No foreign body in the vagina. No signs of injury around the vagina. No vaginal discharge found.  Neurological: She is alert.  Skin: Skin is warm and dry.  Psychiatric: She has a normal mood and affect.    Nursing note and vitals reviewed.    ED Treatments / Results  Labs (all labs ordered are listed, but only abnormal results are displayed) Labs Reviewed  WET PREP, GENITAL - Abnormal; Notable for the following:       Result Value   WBC, Wet Prep HPF POC MODERATE (*)    All other components within normal limits  URINALYSIS, ROUTINE W REFLEX MICROSCOPIC  PREGNANCY, URINE  RPR  HIV ANTIBODY (ROUTINE TESTING)  GC/CHLAMYDIA PROBE AMP ([place]) NOT AT [facility]    Procedures Procedures (including critical care time)  Medications Ordered in ED Medications  cefTRIAXone (ROCEPHIN) injection 250 mg (250 mg Intramuscular Given 11/11/16 1555)  azithromycin (ZITHROMAX) tablet 1,000 mg (1,000 mg Oral Given 11/11/16 1553)  lidocaine (XYLOCAINE) 1 % (with pres) injection (1.2 mLs  Given 11/11/16 1557)     Initial Impression / Assessment and Plan / ED Course  I have reviewed the triage vital signs and the nursing notes.  Pertinent labs & imaging results that were available during my care of the patient were reviewed by me and considered in my medical decision making (see chart for details).     Patient seen and examined.   Vital signs reviewed and are as follows: BP (!) 137/94 (BP Location: Left Arm)   Pulse 93   Temp 98.8 F (37.1 C) (Oral)   Resp 16   Ht  (1.473 m)   Wt 88.9 kg (196 lb)   LMP 11/04/2016   SpO2 100%   BMI 40.96 kg/m   Pelvic exam performed with NT chaperone.   Will test and treat for STD exposure. Patient offered HIV and syphilis testing. Patient counseled on safe sexual practices. Told them that they should not have sexual contact for next 7 days and that they need to inform sexual partners so that they can get tested and treated as well. Patient verbalizes understanding and agrees with plan.     Final Clinical Impressions(s) / ED Diagnoses   Final diagnoses:  STD exposure   Patient tested for STI's. She was treated for [facility] and chlamydia. Diflucan  if she develops yeast infection with antibiotic administration.  New Prescriptions New Prescriptions   FLUCONAZOLE (DIFLUCAN) 150 MG TABLET    Take 1 tablet (150 mg total) by mouth daily.     Renne Crigler, PA-C 11/11/16 1608    Melene Plan, DO 11/12/16 [phone]

## 2016-11-12 LAB — HIV ANTIBODY (ROUTINE TESTING W REFLEX): HIV Screen 4th Generation wRfx: NONREACTIVE

## 2016-11-12 LAB — GC/CHLAMYDIA PROBE AMP (~~LOC~~) NOT AT ARMC
Chlamydia: NEGATIVE
NEISSERIA GONORRHEA: NEGATIVE

## 2016-11-12 LAB — RPR: RPR Ser Ql: NONREACTIVE

## 2017-08-14 ENCOUNTER — Emergency Department (HOSPITAL_BASED_OUTPATIENT_CLINIC_OR_DEPARTMENT_OTHER)
Admission: EM | Admit: 2017-08-14 | Discharge: 2017-08-14 | Disposition: A | Discharge status: Home / Self Care | Payer: Medicaid Other | Attending: Emergency Medicine | Admitting: Emergency Medicine

## 2017-08-14 ENCOUNTER — Encounter (HOSPITAL_BASED_OUTPATIENT_CLINIC_OR_DEPARTMENT_OTHER): Payer: Self-pay

## 2017-08-14 ENCOUNTER — Other Ambulatory Visit: Payer: Self-pay

## 2017-08-14 DIAGNOSIS — J45909 Unspecified asthma, uncomplicated: Secondary | ICD-10-CM | POA: Diagnosis not present

## 2017-08-14 DIAGNOSIS — K0889 Other specified disorders of teeth and supporting structures: Secondary | ICD-10-CM | POA: Diagnosis not present

## 2017-08-14 DIAGNOSIS — R51 Headache: Secondary | ICD-10-CM | POA: Diagnosis present

## 2017-08-14 LAB — URINALYSIS, ROUTINE W REFLEX MICROSCOPIC
Bilirubin Urine: NEGATIVE
Glucose, UA: NEGATIVE mg/dL
Hgb urine dipstick: NEGATIVE
KETONES UR: NEGATIVE mg/dL
LEUKOCYTES UA: NEGATIVE
Nitrite: NEGATIVE
Protein, ur: NEGATIVE mg/dL
Specific Gravity, Urine: 1.015 (ref 1.005–1.030)
pH: 7 (ref 5.0–8.0)

## 2017-08-14 LAB — PREGNANCY, URINE: Preg Test, Ur: NEGATIVE

## 2017-08-14 MED ORDER — PENICILLIN V POTASSIUM 500 MG PO TABS: 500.0000 mg | ORAL_TABLET | Freq: Three times a day (TID) | ORAL | 0 refills | Status: DC

## 2017-08-14 NOTE — ED Notes (Signed)
when pt taken to tx area-requested preg test

## 2017-08-14 NOTE — Discharge Instructions (Signed)
Please read and follow all provided instructions.  Your diagnoses today include:  1. Pain, dental    The exam and treatment you received today has been provided on an emergency basis only. This is not a substitute for complete medical or dental care.  Tests performed today include:  Vital signs. See below for your results today.   Medications prescribed:   Penicillin - antibiotic  You have been prescribed an antibiotic medicine: take the entire course of medicine even if you are feeling better. Stopping early can cause the antibiotic not to work.  Take any prescribed medications only as directed.  Home care instructions:  Follow any educational materials contained in this packet.  Follow-up instructions: Please follow-up with your dentist for further evaluation of your symptoms.   Return instructions:   Please return to the Emergency Department if you experience worsening symptoms.  Please return if you develop a fever, you develop more swelling in your face or neck, you have trouble breathing or swallowing food.  Please return if you have any other emergent concerns.  Additional Information:  Your vital signs today were: BP 129/88 (BP Location: Left Arm)    Pulse 78    Temp 97.9 F (36.6 C) (Oral)    Resp 16    LMP 07/22/2017 (Approximate)    SpO2 100%  If your blood pressure (BP) was elevated above 135/85 this visit, please have this repeated by your doctor within one month. --------------

## 2017-08-14 NOTE — ED Triage Notes (Signed)
Pt c/o pain to left ear, jaw, neck, HA, nausea x 2 weeks and weight gain x 1 week-NAD-steady gait

## 2017-08-14 NOTE — ED Provider Notes (Signed)
[facility] EMERGENCY DEPARTMENT Provider Note   CSN: [id number] Arrival date & time: 08/14/17  1949     History   Chief Complaint Chief Complaint  Patient presents with  . Multiple c/o    HPI Madison Weiss is a 27 y.o. female.  Patient presents with c/o facial and ear pain for several days with associated HA. Patient has used temporary filling in a left lower molar that had a big crack in it.  Patient is concerned for dental infection.  No facial swelling.  No fevers, nausea or vomiting.  Patient is also had some nausea without significant abdominal pain.  Patient was concerned for pregnancy.  No other treatments prior to arrival.     Past Medical History:  Diagnosis Date  . Anxiety   . Arrhythmia    currently seeing cardiologist  . Asthma    Albuterol  . Chronic back pain    d/t "the size of my boobs"  . Depression   . Hx of chlamydia infection    treated  . Hx of trichomoniasis    treated  . Obesity   . PIH (pregnancy induced hypertension) 10/03/2014    Patient Active Problem List   Diagnosis Date Noted  . S/P cesarean section 10/17/2014  . Pre-eclampsia, antepartum 10/13/2014  . PIH (pregnancy induced hypertension) 10/03/2014  . [redacted] weeks gestation of pregnancy   . Encounter for (NT) nuchal translucency scan     Past Surgical History:  Procedure Laterality Date  . CESAREAN SECTION N/A 10/17/2014   Procedure: CESAREAN SECTION;  Surgeon: Kathreen CosierBernard A Marshall, MD;  Location: [facility];  Service: Obstetrics;  Laterality: N/A;  . NO PAST SURGERIES       OB History    Gravida  1   Para  1   Term      Preterm  1   AB      Living  1     SAB      TAB      Ectopic      Multiple  0   Live Births  1            Home Medications    Prior to Admission medications   Medication Sig Start Date End Date Taking? Authorizing Provider  penicillin v potassium (VEETID) 500 MG tablet Take 1 tablet (500 mg total) by mouth 3 (three) times daily.  08/14/17   Renne CriglerGeiple, Sharonne Ricketts, PA-C    Family History No family history on file.  Social History Social History   Tobacco Use  . Smoking status: Never Smoker  . Smokeless tobacco: Never Used  Substance Use Topics  . Alcohol use: Yes    Comment: occ  . Drug use: No     Allergies   Patient has no known allergies.   Review of Systems Review of Systems  Constitutional: Negative for fever.  HENT: Positive for dental problem and ear pain. Negative for facial swelling, sore throat and trouble swallowing.   Respiratory: Negative for shortness of breath and stridor.   Gastrointestinal: Positive for nausea. Negative for vomiting.  Musculoskeletal: Negative for neck pain.  Skin: Negative for color change.  Neurological: Positive for headaches.     Physical Exam Updated Vital Signs BP 129/88 (BP Location: Left Arm)   Pulse 78   Temp 97.9 F (36.6 C) (Oral)   Resp 16   LMP 07/22/2017 (Approximate)   SpO2 100%   Physical Exam  Constitutional: She appears well-developed and well-nourished.  HENT:  Head: Normocephalic and atraumatic.  Right Ear: Tympanic membrane, external ear and ear canal normal.  Left Ear: Tympanic membrane, external ear and ear canal normal.  Nose: Nose normal.  Mouth/Throat: Uvula is midline, oropharynx is clear and moist and mucous membranes are normal. No trismus in the jaw. Abnormal dentition. Dental caries present. No dental abscesses or uvula swelling. No tonsillar abscesses.  Patient with temporary filling noted in tooth #19 and 20.  No gross abscess.  Eyes: Conjunctivae are normal.  Neck: Normal range of motion. Neck supple.  No neck swelling or Ludwig's angina  Abdominal: Soft. There is no tenderness.  Lymphadenopathy:    She has no cervical adenopathy.  Neurological: She is alert.  Skin: Skin is warm and dry.  Psychiatric: She has a normal mood and affect.  Nursing note and vitals reviewed.    ED Treatments / Results  Labs (all labs  ordered are listed, but only abnormal results are displayed) Labs Reviewed  URINALYSIS, ROUTINE W REFLEX MICROSCOPIC  PREGNANCY, URINE    EKG None  Radiology No results found.  Procedures Procedures (including critical care time)  Medications Ordered in ED Medications - No data to display   Initial Impression / Assessment and Plan / ED Course  I have reviewed the triage vital signs and the nursing notes.  Pertinent labs & imaging results that were available during my care of the patient were reviewed by me and considered in my medical decision making (see chart for details).     10:07 PM Patient seen and examined. Rx penicillin for possible dental infection. Informed of urine results.   Vital signs reviewed and are as follows: BP 129/88 (BP Location: Left Arm)   Pulse 78   Temp 97.9 F (36.6 C) (Oral)   Resp 16   LMP 07/22/2017 (Approximate)   SpO2 100%   Patient counseled to take prescribed medications as directed, return with worsening facial or neck swelling, and to follow-up with their dentist as soon as possible.   Final Clinical Impressions(s) / ED Diagnoses   Final diagnoses:  Pain, dental   Patient with facial and ear pain likely due to dental pain. No fever. Exam unconcerning for Ludwig's angina or other deep tissue infection in neck.    ED Discharge Orders        Ordered    penicillin v potassium (VEETID) 500 MG tablet  3 times daily     08/14/17 2144       Renne Crigler, PA-C 08/14/17 2208    Melene Plan, DO 08/14/17 2304

## 2017-08-15 ENCOUNTER — Emergency Department (HOSPITAL_BASED_OUTPATIENT_CLINIC_OR_DEPARTMENT_OTHER)
Admission: EM | Admit: 2017-08-15 | Discharge: 2017-08-15 | Disposition: A | Discharge status: Home / Self Care | Payer: Medicaid Other | Attending: Emergency Medicine | Admitting: Emergency Medicine

## 2017-08-15 ENCOUNTER — Other Ambulatory Visit: Payer: Self-pay

## 2017-08-15 ENCOUNTER — Encounter (HOSPITAL_BASED_OUTPATIENT_CLINIC_OR_DEPARTMENT_OTHER): Payer: Self-pay | Admitting: *Deleted

## 2017-08-15 ENCOUNTER — Emergency Department (HOSPITAL_BASED_OUTPATIENT_CLINIC_OR_DEPARTMENT_OTHER): Payer: Medicaid Other

## 2017-08-15 DIAGNOSIS — R0789 Other chest pain: Secondary | ICD-10-CM | POA: Diagnosis not present

## 2017-08-15 DIAGNOSIS — J45909 Unspecified asthma, uncomplicated: Secondary | ICD-10-CM | POA: Insufficient documentation

## 2017-08-15 DIAGNOSIS — R079 Chest pain, unspecified: Secondary | ICD-10-CM

## 2017-08-15 MED ORDER — GI COCKTAIL ~~LOC~~
30.0000 mL | Freq: Once | ORAL | Status: AC
  Administered 2017-08-15: 30 mL via ORAL
  Filled 2017-08-15: qty 30

## 2017-08-15 NOTE — Discharge Instructions (Signed)
Please read and follow all provided instructions.  Your diagnoses today include:  1. Nonspecific chest pain     Tests performed today include:  An EKG of your heart  A chest x-ray  Vital signs. See below for your results today.   Medications prescribed:   None  Take any prescribed medications only as directed.  Follow-up instructions: Please follow-up with your primary care provider as soon as you can for further evaluation of your symptoms.   Return instructions:  SEEK IMMEDIATE MEDICAL ATTENTION IF:  You have severe chest pain, especially if the pain is crushing or pressure-like and spreads to the arms, back, neck, or jaw, or if you have sweating, nausea (feeling sick to your stomach), or shortness of breath. THIS IS AN EMERGENCY. Don't wait to see if the pain will go away. Get medical help at once. Call 911 or 0 (operator). DO NOT drive yourself to the hospital.   Your chest pain gets worse and does not go away with rest.   You have an attack of chest pain lasting longer than usual, despite rest and treatment with the medications your caregiver has prescribed.   You wake from sleep with chest pain or shortness of breath.  You feel dizzy or faint.  You have chest pain not typical of your usual pain for which you originally saw your caregiver.   You have any other emergent concerns regarding your health.  Additional Information: Chest pain comes from many different causes. Your caregiver has diagnosed you as having chest pain that is not specific for one problem, but does not require admission.  You are at low risk for an acute heart condition or other serious illness.   Your vital signs today were: BP (!) 132/97 (BP Location: Left Arm)    Pulse 81    Temp 98.3 F (36.8 C) (Oral)    Resp 18    Ht 4\' 10"  (1.473 m)    Wt 94.8 kg (209 lb)    LMP 07/22/2017 (Approximate)    SpO2 100%    BMI 43.68 kg/m  If your blood pressure (BP) was elevated above 135/85 this visit,  please have this repeated by your doctor within one month. --------------

## 2017-08-15 NOTE — ED Triage Notes (Signed)
Pt reports left side chest pain, onset just prior to arrival. States pain started after she ate and is worse with exhalation. NAD. Pt also has a TB skin test placed on her right forearm x 1 week ago that she did not have read yet and she is concerned it may be +

## 2017-08-15 NOTE — ED Provider Notes (Signed)
[facility] EMERGENCY DEPARTMENT Provider Note   CSN: [id number] Arrival date & time: 08/15/17  1526     History   Chief Complaint Chief Complaint  Patient presents with  . Chest Pain    HPI Madison Weiss is a 28 y.o. female.  Patient presents to the emergency department with complaint of left-sided chest pressure starting approximately 1 hour after eating a cheese steak with extra cheese and extra mayo.  Patient feels the pressure more when she breathes out.  She also feels it in her back.  She denies any fevers or cough.  No nausea, vomiting, or diarrhea.  No lightheadedness or syncope.  No history of blood clots.  She has been taking NSAIDs for dental pain.  Patient also notes that she had a TB skin test placed 1 week ago for her job but did not have it read at the appropriate time.  She still sees an area of discoloration and she is worried that she has a positive test.  Patient seen by myself yesterday for left-sided dental pain.  She continues to have intermittent dental pain, relieved with Excedrin which she has taken several times with improvement.  She is planning on filling the penicillin provided yesterday.  No facial swelling.     Past Medical History:  Diagnosis Date  . Anxiety   . Arrhythmia    currently seeing cardiologist  . Asthma    Albuterol  . Chronic back pain    d/t "the size of my boobs"  . Depression   . Hx of chlamydia infection    treated  . Hx of trichomoniasis    treated  . Obesity   . PIH (pregnancy induced hypertension) 10/03/2014    Patient Active Problem List   Diagnosis Date Noted  . S/P cesarean section 10/17/2014  . Pre-eclampsia, antepartum 10/13/2014  . PIH (pregnancy induced hypertension) 10/03/2014  . [redacted] weeks gestation of pregnancy   . Encounter for (NT) nuchal translucency scan     Past Surgical History:  Procedure Laterality Date  . CESAREAN SECTION N/A 10/17/2014   Procedure: CESAREAN SECTION;  Surgeon:  Kathreen Cosier, MD;  Location: [facility];  Service: Obstetrics;  Laterality: N/A;  . NO PAST SURGERIES       OB History    Gravida  1   Para  1   Term      Preterm  1   AB      Living  1     SAB      TAB      Ectopic      Multiple  0   Live Births  1            Home Medications    Prior to Admission medications   Medication Sig Start Date End Date Taking? Authorizing Provider  penicillin v potassium (VEETID) 500 MG tablet Take 1 tablet (500 mg total) by mouth 3 (three) times daily. 08/14/17   Renne Crigler, PA-C    Family History No family history on file.  Social History Social History   Tobacco Use  . Smoking status: Never Smoker  . Smokeless tobacco: Never Used  Substance Use Topics  . Alcohol use: Yes    Comment: occ  . Drug use: No     Allergies   Patient has no known allergies.   Review of Systems Review of Systems  Constitutional: Negative for diaphoresis and fever.  HENT: Positive for dental problem.   Eyes: Negative  for redness.  Respiratory: Negative for cough and shortness of breath.   Cardiovascular: Positive for chest pain. Negative for palpitations and leg swelling.  Gastrointestinal: Negative for abdominal pain, nausea and vomiting.  Genitourinary: Negative for dysuria.  Musculoskeletal: Negative for back pain and neck pain.  Skin: Positive for color change. Negative for rash.  Neurological: Negative for syncope and light-headedness.  Psychiatric/Behavioral: The patient is not nervous/anxious.      Physical Exam Updated Vital Signs BP (!) 132/97 (BP Location: Left Arm)   Pulse 81   Temp 98.3 F (36.8 C) (Oral)   Resp 18   Ht 4\' 10"  (1.473 m)   Wt 94.8 kg (209 lb)   LMP 07/22/2017 (Approximate)   SpO2 100%   BMI 43.68 kg/m   Physical Exam  Constitutional: She appears well-developed and well-nourished.  HENT:  Head: Normocephalic and atraumatic.  Mouth/Throat: Mucous membranes are normal. Mucous membranes are  not dry.  Eyes: Conjunctivae are normal.  Neck: Trachea normal and normal range of motion. Neck supple. Normal carotid pulses and no JVD present. No muscular tenderness present. Carotid bruit is not present. No tracheal deviation present.  Cardiovascular: Normal rate, regular rhythm, S1 normal, S2 normal, normal heart sounds and intact distal pulses. Exam reveals no decreased pulses.  No murmur heard. Pulmonary/Chest: Effort normal. No respiratory distress. She has no wheezes. She exhibits no tenderness.  Abdominal: Soft. Normal aorta and bowel sounds are normal. There is no tenderness. There is no rebound and no guarding.  Musculoskeletal: Normal range of motion.  Neurological: She is alert.  Skin: Skin is warm and dry. She is not diaphoretic. No cyanosis. No pallor.  Patient with small macule on right forearm where she had a TB test placed.  There is no induration.   Psychiatric: She has a normal mood and affect.  Nursing note and vitals reviewed.    ED Treatments / Results  Labs (all labs ordered are listed, but only abnormal results are displayed) Labs Reviewed - No data to display  EKG EKG Interpretation  Date/Time:  Saturday August 15 2017 15:37:08 EDT Ventricular Rate:  88 PR Interval:  112 QRS Duration: 66 QT Interval:  336 QTC Calculation: 406 R Axis:   57 Text Interpretation:  Normal sinus rhythm Normal ECG When compared to prior, no significnat changes seen.  No STEMI Confirmed by Theda Belfastegeler, Chris ([id number]) on 08/15/2017 3:48:16 PM   Radiology No results found.  Procedures Procedures (including critical care time)  Medications Ordered in ED Medications  gi cocktail (Maalox,Lidocaine,Donnatal) (30 mLs Oral Given 08/15/17 1624)     Initial Impression / Assessment and Plan / ED Course  I have reviewed the triage vital signs and the nursing notes.  Pertinent labs & imaging results that were available during my care of the patient were reviewed by me and considered in  my medical decision making (see chart for details).     Patient seen and examined.  Will give GI cocktail and check a chest x-ray.  EKG reviewed.  Vital signs reviewed and are as follows: BP (!) 132/97 (BP Location: Left Arm)   Pulse 81   Temp 98.3 F (36.8 C) (Oral)   Resp 18   Ht 4\' 10"  (1.473 m)   Wt 94.8 kg (209 lb)   LMP 07/22/2017 (Approximate)   SpO2 100%   BMI 43.68 kg/m   5:30 PM patient stable.  GI cocktail did not really change her symptoms.  Chest x-ray results reviewed with patient.  Discussed  EKG.  Discussed bland foods for the next 24 hours.  Discussed trying to use Tylenol for her dental pain as opposed to Excedrin or ibuprofen.  She verbalizes understanding agrees with plan.  Encouraged patient to return with worsening symptoms, worsening chest pain, shortness of breath, fevers, vomiting, new symptoms or other concerns.  Final Clinical Impressions(s) / ED Diagnoses   Final diagnoses:  Nonspecific chest pain   Patient with nonspecific atypical chest pain after eating a cheesesteak today.  EKG is normal.  Doubt cardiac etiology given description of pain and history.  Do not suspect PE.  Patient without abdominal pain to suggest gallbladder etiology.  Chest x-ray is clear without signs of infection, pneumothorax, or other acute abnormality.  Treatment plan as above.   ED Discharge Orders    None       Renne Crigler, Cordelia Poche 08/15/17 1732    Tegeler, Canary Brim, MD 08/16/17 [phone]

## 2017-11-17 ENCOUNTER — Emergency Department (HOSPITAL_BASED_OUTPATIENT_CLINIC_OR_DEPARTMENT_OTHER): Payer: Medicaid Other

## 2017-11-17 ENCOUNTER — Other Ambulatory Visit: Payer: Self-pay

## 2017-11-17 ENCOUNTER — Emergency Department (HOSPITAL_BASED_OUTPATIENT_CLINIC_OR_DEPARTMENT_OTHER)
Admission: EM | Admit: 2017-11-17 | Discharge: 2017-11-17 | Disposition: A | Discharge status: Home / Self Care | Payer: Medicaid Other | Attending: Emergency Medicine | Admitting: Emergency Medicine

## 2017-11-17 ENCOUNTER — Encounter (HOSPITAL_BASED_OUTPATIENT_CLINIC_OR_DEPARTMENT_OTHER): Payer: Self-pay | Admitting: *Deleted

## 2017-11-17 DIAGNOSIS — R519 Headache, unspecified: Secondary | ICD-10-CM

## 2017-11-17 DIAGNOSIS — J45909 Unspecified asthma, uncomplicated: Secondary | ICD-10-CM | POA: Insufficient documentation

## 2017-11-17 DIAGNOSIS — R51 Headache: Secondary | ICD-10-CM | POA: Insufficient documentation

## 2017-11-17 LAB — PREGNANCY, URINE: PREG TEST UR: NEGATIVE

## 2017-11-17 MED ORDER — SODIUM CHLORIDE 0.9 % IV SOLN
Freq: Once | INTRAVENOUS | Status: AC
  Administered 2017-11-17: 14:00:00 via INTRAVENOUS

## 2017-11-17 MED ORDER — IBUPROFEN 800 MG PO TABS
800.0000 mg | ORAL_TABLET | Freq: Once | ORAL | Status: AC
  Administered 2017-11-17: 800 mg via ORAL
  Filled 2017-11-17: qty 1

## 2017-11-17 MED ORDER — DIPHENHYDRAMINE HCL 50 MG/ML IJ SOLN
25.0000 mg | Freq: Once | INTRAMUSCULAR | Status: DC
  Filled 2017-11-17: qty 1

## 2017-11-17 MED ORDER — ACETAMINOPHEN 500 MG PO TABS
1000.0000 mg | ORAL_TABLET | Freq: Once | ORAL | Status: AC
  Administered 2017-11-17: 1000 mg via ORAL
  Filled 2017-11-17: qty 2

## 2017-11-17 MED ORDER — METOCLOPRAMIDE HCL 5 MG/ML IJ SOLN
10.0000 mg | Freq: Once | INTRAMUSCULAR | Status: DC
  Filled 2017-11-17: qty 2

## 2017-11-17 NOTE — ED Notes (Signed)
In to assess the pt.    Pt. Was on her Cell phone Rn asked Pt. To hang up so RN could assess the Pt.  Pt. Did hang phone up and Pt. Was assessed.  Pt. Gave urine.

## 2017-11-17 NOTE — ED Notes (Signed)
Pt. Reports she has pain all over and on the R side more than anywhere.

## 2017-11-17 NOTE — ED Provider Notes (Signed)
[facility] EMERGENCY DEPARTMENT Provider Note   CSN: [id number] Arrival date & time: 11/17/17  1204     History   Chief Complaint Chief Complaint  Patient presents with  . Headache    HPI Madison Weiss is a 27 y.o. female.  27 year old female, no significant medical history, presents with a 1 day history of a headache.  Patient states a history of headaches often.  She describes typical headaches as a throbbing pain on the right side of the head radiating to the right neck.  She notes associated right ear pain.  Patient states headaches are becoming more frequent and pain is worsening over the last week.  Description is a gradual onset throbbing over several hours.  Patient denies sudden onset of headache, worst headache of her life.  She denies any fevers, chills, neck stiffness, cough, rhinorrhea.  Patient denies any vision loss, slurred speech, numbness, tingling, difficulty ambulating.  Triage notes is left-sided pain.  However patient's pain is all right-sided headache and right ear pain.   The history is provided by the patient.  Headache   The problem occurs constantly. The pain radiates to the right neck. Pertinent negatives include no fever, no shortness of breath, no nausea and no vomiting.    Past Medical History:  Diagnosis Date  . Anxiety   . Arrhythmia    currently seeing cardiologist  . Asthma    Albuterol  . Chronic back pain    d/t "the size of my boobs"  . Depression   . Hx of chlamydia infection    treated  . Hx of trichomoniasis    treated  . Obesity   . PIH (pregnancy induced hypertension) 10/03/2014    Patient Active Problem List   Diagnosis Date Noted  . S/P cesarean section 10/17/2014  . Pre-eclampsia, antepartum 10/13/2014  . PIH (pregnancy induced hypertension) 10/03/2014  . [redacted] weeks gestation of pregnancy   . Encounter for (NT) nuchal translucency scan     Past Surgical History:  Procedure Laterality Date  . CESAREAN  SECTION N/A 10/17/2014   Procedure: CESAREAN SECTION;  Surgeon: Kathreen Cosier, MD;  Location: [facility];  Service: Obstetrics;  Laterality: N/A;  . NO PAST SURGERIES       OB History    Gravida  1   Para  1   Term      Preterm  1   AB      Living  1     SAB      TAB      Ectopic      Multiple  0   Live Births  1            Home Medications    Prior to Admission medications   Medication Sig Start Date End Date Taking? Authorizing Provider  penicillin v potassium (VEETID) 500 MG tablet Take 1 tablet (500 mg total) by mouth 3 (three) times daily. 08/14/17   Renne Crigler, PA-C    Family History No family history on file.  Social History Social History   Tobacco Use  . Smoking status: Never Smoker  . Smokeless tobacco: Never Used  Substance Use Topics  . Alcohol use: Yes    Comment: occ  . Drug use: No     Allergies   Patient has no known allergies.   Review of Systems Review of Systems  Constitutional: Negative for chills and fever.  HENT: Positive for ear pain (right ear). Negative for rhinorrhea and  sore throat.   Eyes: Negative for visual disturbance.  Respiratory: Negative for cough and shortness of breath.   Cardiovascular: Negative for chest pain and leg swelling.  Gastrointestinal: Negative for abdominal pain, diarrhea, nausea and vomiting.  Genitourinary: Negative for dysuria, frequency and urgency.  Musculoskeletal: Positive for neck pain (right sided).  Skin: Negative for rash and wound.  Neurological: Positive for headaches. Negative for syncope, speech difficulty and numbness.  All other systems reviewed and are negative.    Physical Exam Updated Vital Signs BP (!) 130/94   Pulse 81   Temp 98 F (36.7 C) (Oral)   Resp 18   Ht 4\' 10"  (1.473 m)   Wt 96.2 kg   LMP 11/10/2017   SpO2 100%   BMI 44.31 kg/m   Physical Exam  Constitutional: She is oriented to person, place, and time. She appears well-developed and  well-nourished.  HENT:  Head: Normocephalic and atraumatic.  Eyes: Conjunctivae and EOM are normal.  Neck: Neck supple. No tracheal tenderness present. No Brudzinski's sign and no Kernig's sign noted.  Cardiovascular: Normal rate, regular rhythm and normal heart sounds.  No murmur heard. Pulmonary/Chest: Effort normal and breath sounds normal. No respiratory distress. She has no wheezes. She has no rales.  Abdominal: Soft. Bowel sounds are normal. She exhibits no distension. There is no tenderness.  Musculoskeletal: Normal range of motion. She exhibits no tenderness or deformity.  Neurological: She is alert and oriented to person, place, and time. She has normal strength. No cranial nerve deficit or sensory deficit. Coordination normal. GCS eye subscore is 4. GCS verbal subscore is 5. GCS motor subscore is 6.  Skin: Skin is warm and dry. No rash noted. No erythema.  Psychiatric: She has a normal mood and affect. Her behavior is normal.  Nursing note and vitals reviewed.    ED Treatments / Results  Labs (all labs ordered are listed, but only abnormal results are displayed) Labs Reviewed  PREGNANCY, URINE    EKG None  Radiology No results found.  Procedures Procedures (including critical care time)  Medications Ordered in ED Medications  diphenhydrAMINE (BENADRYL) injection 25 mg (has no administration in time range)  metoCLOPramide (REGLAN) injection 10 mg (has no administration in time range)     Initial Impression / Assessment and Plan / ED Course  I have reviewed the triage vital signs and the nursing notes.  Pertinent labs & imaging results that were available during my care of the patient were reviewed by me and considered in my medical decision making (see chart for details).     3:57 PM Headache unchanged.  Declined all IV medication.  Offered p.o. Benadryl and Reglan.  Patient declined.  She states she will only take Tylenol and Motrin for her headache.  Offered  to dose Tylenol Motrin and wait to see if symptoms improve.  Patient states after she gets her medication she would like to leave.  CT head shows no acute findings.  H&P not consistent with subarachnoid given gradual onset of symptoms.  No meningeal signs, afebrile, not tachycardic.  At this time there does not appear to be any evidence of an acute emergency medical condition and the patient appears stable for discharge with appropriate outpatient follow up.Diagnosis was discussed with patient who verbalizes understanding and is agreeable to discharge.  Final Clinical Impressions(s) / ED Diagnoses   Final diagnoses:  None    ED Discharge Orders    None       Doylene Splinter  Kathie Rhodes, PA-C 11/17/17 2107    Tegeler, Canary Brim, MD 11/18/17 1451

## 2017-11-17 NOTE — ED Triage Notes (Signed)
Headache and pain in her right ear. States she has a hx of headaches.

## 2017-11-17 NOTE — Discharge Instructions (Addendum)
Immediately for new or worsening symptoms, such as fevers, neck stiffness, vomiting or any concerns at all.

## 2018-01-26 ENCOUNTER — Other Ambulatory Visit: Payer: Self-pay

## 2018-01-26 ENCOUNTER — Encounter (HOSPITAL_BASED_OUTPATIENT_CLINIC_OR_DEPARTMENT_OTHER): Payer: Self-pay | Admitting: *Deleted

## 2018-01-26 ENCOUNTER — Emergency Department (HOSPITAL_BASED_OUTPATIENT_CLINIC_OR_DEPARTMENT_OTHER)
Admission: EM | Admit: 2018-01-26 | Discharge: 2018-01-26 | Disposition: A | Discharge status: Home / Self Care | Payer: Medicaid Other | Attending: Emergency Medicine | Admitting: Emergency Medicine

## 2018-01-26 DIAGNOSIS — J45909 Unspecified asthma, uncomplicated: Secondary | ICD-10-CM | POA: Insufficient documentation

## 2018-01-26 DIAGNOSIS — R252 Cramp and spasm: Secondary | ICD-10-CM | POA: Diagnosis not present

## 2018-01-26 DIAGNOSIS — G5139 Clonic hemifacial spasm, unspecified: Secondary | ICD-10-CM

## 2018-01-26 DIAGNOSIS — R202 Paresthesia of skin: Secondary | ICD-10-CM | POA: Diagnosis present

## 2018-01-26 HISTORY — DX: Bell's palsy: G51.0

## 2018-01-26 NOTE — ED Notes (Signed)
ED Provider at bedside. 

## 2018-01-26 NOTE — Discharge Instructions (Signed)
Follow-up with your primary care provider for any further evaluation of this issue. May follow up with a neurologist, as needed.

## 2018-01-26 NOTE — ED Provider Notes (Addendum)
[facility] EMERGENCY DEPARTMENT Provider Note   CSN: [id number] Arrival date & time: 01/26/18  1145     History   Chief Complaint Chief Complaint  Patient presents with  . Numbness    HPI Madison Weiss is a 27 y.o. female.  HPI   Madison Weiss is a 27 y.o. female, with a history of anxiety, asthma, and Bell's palsy, presenting to the ED with a "tightness" to the right side of the face for the past 3 days.  Sometimes she feels as though these facial muscles have tiny spasms.  She had an instance of Bell's palsy 2 years ago affecting the right side and is concerned it may be recurring. After speaking for a while with the patient, she also states that she has been under increased stress, has had decreased sleep, and has had poor oral intake.  She admits, "Sometimes my anxiety gets the best of me and that may be what is happening right now. I'm actually now thinking I may have had some tightness to the right side of the face that never went away from the last time."  Denies fever, facial droop, vision loss, diplopia, extremity/facial numbness, headaches, extremity weakness, facial pain, or any other complaints.   Past Medical History:  Diagnosis Date  . Anxiety   . Arrhythmia    currently seeing cardiologist  . Asthma    Albuterol  . Bell's palsy   . Chronic back pain    d/t "the size of my boobs"  . Depression   . Hx of chlamydia infection    treated  . Hx of trichomoniasis    treated  . Obesity   . PIH (pregnancy induced hypertension) 10/03/2014    Patient Active Problem List   Diagnosis Date Noted  . S/P cesarean section 10/17/2014  . Pre-eclampsia, antepartum 10/13/2014  . PIH (pregnancy induced hypertension) 10/03/2014  . [redacted] weeks gestation of pregnancy   . Encounter for (NT) nuchal translucency scan     Past Surgical History:  Procedure Laterality Date  . CESAREAN SECTION N/A 10/17/2014   Procedure: CESAREAN SECTION;  Surgeon: Kathreen CosierBernard A Marshall,  MD;  Location: [facility];  Service: Obstetrics;  Laterality: N/A;  . NO PAST SURGERIES       OB History    Gravida  1   Para  1   Term      Preterm  1   AB      Living  1     SAB      TAB      Ectopic      Multiple  0   Live Births  1            Home Medications    Prior to Admission medications   Not on File    Family History No family history on file.  Social History Social History   Tobacco Use  . Smoking status: Never Smoker  . Smokeless tobacco: Never Used  Substance Use Topics  . Alcohol use: Yes    Comment: occ  . Drug use: No     Allergies   Patient has no known allergies.   Review of Systems Review of Systems  Constitutional: Negative for chills, diaphoresis and fever.  HENT: Negative for facial swelling.   Eyes: Negative for visual disturbance.  Respiratory: Negative for shortness of breath.   Cardiovascular: Negative for chest pain.  Gastrointestinal: Negative for abdominal pain, nausea and vomiting.  Musculoskeletal: Negative for neck pain  and neck stiffness.  Neurological: Negative for dizziness, syncope, facial asymmetry, speech difficulty, weakness, light-headedness, numbness and headaches.       Feeling of tightness on right side of face.  All other systems reviewed and are negative.    Physical Exam Updated Vital Signs BP 114/76 (BP Location: Right Arm)   Pulse 73   Temp 98.3 F (36.8 C) (Oral)   Resp 16   Ht 4\' 10"  (1.473 m)   Wt 96.3 kg   LMP 01/26/2018   SpO2 100%   BMI 44.37 kg/m   Physical Exam Vitals signs and nursing note reviewed.  Constitutional:      General: She is not in acute distress.    Appearance: She is well-developed. She is not diaphoretic.  HENT:     Head: Normocephalic and atraumatic.     Mouth/Throat:     Pharynx: Oropharynx is clear.  Eyes:     General: Lids are normal.     Extraocular Movements: Extraocular movements intact.     Conjunctiva/sclera: Conjunctivae normal.      Pupils: Pupils are equal, round, and reactive to light.  Neck:     Musculoskeletal: Neck supple.  Cardiovascular:     Rate and Rhythm: Normal rate and regular rhythm.  Pulmonary:     Effort: Pulmonary effort is normal. No respiratory distress.  Abdominal:     Tenderness: There is no guarding.  Lymphadenopathy:     Cervical: No cervical adenopathy.  Skin:    General: Skin is warm and dry.  Neurological:     Mental Status: She is alert.     Comments: Sensation grossly intact to light touch in the extremities. No noted speech deficits. No aphasia. Patient handles oral secretions without difficulty. No noted swallowing defects.  Equal grip strength bilaterally. Strength 5/5 in the upper extremities. Strength 5/5 in the bilateral lower extremities. No gait disturbance.  Coordination intact including heel to shin and finger to nose.  Cranial nerves III-XII grossly intact.  No facial droop.  Sensation to light touch equally intact to the face bilaterally. No eyelid ptosis.  Forehead furrowing and rise equal bilaterally.  Psychiatric:        Behavior: Behavior normal.      ED Treatments / Results  Labs (all labs ordered are listed, but only abnormal results are displayed) Labs Reviewed - No data to display  EKG None  Radiology No results found.  Procedures Procedures (including critical care time)  Medications Ordered in ED Medications - No data to display   Initial Impression / Assessment and Plan / ED Course  I have reviewed the triage vital signs and the nursing notes.  Pertinent labs & imaging results that were available during my care of the patient were reviewed by me and considered in my medical decision making (see chart for details).     Patient presents with a feeling of "tightness" to the right side of the face.  I see no focal neuro deficits on her exam today.  Return precautions discussed.  Final Clinical Impressions(s) / ED Diagnoses   Final  diagnoses:  Facial nerve spasm    ED Discharge Orders    None       Concepcion LivingJoy, Marijean Montanye C, PA-C 01/26/18 1437    Anselm PancoastJoy, Eliany Mccarter C, PA-C 01/26/18 1438    Linwood DibblesKnapp, Jon, MD 01/30/18 1624

## 2018-01-26 NOTE — ED Triage Notes (Signed)
States she had Bells Palsy 2 years ago. Her symptoms never went away completely. For the past 2 days she has had tingling in the right side of her face. She feels her Bell's Palsy is coming back.

## 2018-03-18 ENCOUNTER — Emergency Department (HOSPITAL_BASED_OUTPATIENT_CLINIC_OR_DEPARTMENT_OTHER)
Admission: EM | Admit: 2018-03-18 | Discharge: 2018-03-18 | Disposition: A | Discharge status: Home / Self Care | Payer: Medicaid Other | Attending: Emergency Medicine | Admitting: Emergency Medicine

## 2018-03-18 ENCOUNTER — Emergency Department (HOSPITAL_BASED_OUTPATIENT_CLINIC_OR_DEPARTMENT_OTHER): Payer: Medicaid Other

## 2018-03-18 ENCOUNTER — Other Ambulatory Visit: Payer: Self-pay

## 2018-03-18 ENCOUNTER — Encounter (HOSPITAL_BASED_OUTPATIENT_CLINIC_OR_DEPARTMENT_OTHER): Payer: Self-pay | Admitting: Emergency Medicine

## 2018-03-18 DIAGNOSIS — R6883 Chills (without fever): Secondary | ICD-10-CM | POA: Diagnosis not present

## 2018-03-18 DIAGNOSIS — B349 Viral infection, unspecified: Secondary | ICD-10-CM | POA: Diagnosis not present

## 2018-03-18 DIAGNOSIS — J45909 Unspecified asthma, uncomplicated: Secondary | ICD-10-CM | POA: Insufficient documentation

## 2018-03-18 DIAGNOSIS — M791 Myalgia, unspecified site: Secondary | ICD-10-CM | POA: Diagnosis present

## 2018-03-18 LAB — URINALYSIS, ROUTINE W REFLEX MICROSCOPIC
Bilirubin Urine: NEGATIVE
Glucose, UA: NEGATIVE mg/dL
Hgb urine dipstick: NEGATIVE
Ketones, ur: NEGATIVE mg/dL
Leukocytes,Ua: NEGATIVE
Nitrite: NEGATIVE
PH: 6.5 (ref 5.0–8.0)
PROTEIN: NEGATIVE mg/dL
Specific Gravity, Urine: 1.025 (ref 1.005–1.030)

## 2018-03-18 LAB — PREGNANCY, URINE: PREG TEST UR: NEGATIVE

## 2018-03-18 MED ORDER — ACETAMINOPHEN 325 MG PO TABS
650.0000 mg | ORAL_TABLET | Freq: Once | ORAL | Status: AC
  Administered 2018-03-18: 650 mg via ORAL
  Filled 2018-03-18: qty 2

## 2018-03-18 NOTE — ED Notes (Signed)
ED Provider at bedside. 

## 2018-03-18 NOTE — Discharge Instructions (Addendum)
Chest x-ray showed no signs of infection.  Your EKG showed that your heart is in good condition.  Urinalysis was negative for infection.  Pregnancy test was negative.  Recommend Tylenol and Motrin for body aches.  Recommend follow-up with primary care doctor.

## 2018-03-18 NOTE — ED Provider Notes (Signed)
[facility] EMERGENCY DEPARTMENT Provider Note   CSN: [id number] Arrival date & time: 03/18/18  2020    History   Chief Complaint Chief Complaint  Patient presents with  . Generalized Body Aches    HPI Madison Weiss is a 28 y.o. female.     The history is provided by the patient.  URI  Presenting symptoms: no cough, no ear pain, no fever and no sore throat   Severity:  Mild Onset quality:  Gradual Timing:  Constant Progression:  Unchanged Chronicity:  New Relieved by:  Nothing Worsened by:  Nothing Associated symptoms: myalgias   Associated symptoms: no arthralgias     Past Medical History:  Diagnosis Date  . Anxiety   . Arrhythmia    currently seeing cardiologist  . Asthma    Albuterol  . Bell's palsy   . Chronic back pain    d/t "the size of my boobs"  . Depression   . Hx of chlamydia infection    treated  . Hx of trichomoniasis    treated  . Obesity   . PIH (pregnancy induced hypertension) 10/03/2014    Patient Active Problem List   Diagnosis Date Noted  . S/P cesarean section 10/17/2014  . Pre-eclampsia, antepartum 10/13/2014  . PIH (pregnancy induced hypertension) 10/03/2014  . [redacted] weeks gestation of pregnancy   . Encounter for (NT) nuchal translucency scan     Past Surgical History:  Procedure Laterality Date  . CESAREAN SECTION N/A 10/17/2014   Procedure: CESAREAN SECTION;  Surgeon: Kathreen Cosier, MD;  Location: [facility];  Service: Obstetrics;  Laterality: N/A;  . NO PAST SURGERIES       OB History    Gravida  1   Para  1   Term      Preterm  1   AB      Living  1     SAB      TAB      Ectopic      Multiple  0   Live Births  1            Home Medications    Prior to Admission medications   Not on File    Family History No family history on file.  Social History Social History   Tobacco Use  . Smoking status: Never Smoker  . Smokeless tobacco: Never Used  Substance Use Topics  . Alcohol  use: Yes    Comment: occ  . Drug use: No     Allergies   Patient has no known allergies.   Review of Systems Review of Systems  Constitutional: Positive for chills. Negative for fever.  HENT: Negative for ear pain and sore throat.   Eyes: Negative for pain and visual disturbance.  Respiratory: Negative for cough and shortness of breath.   Cardiovascular: Negative for chest pain and palpitations.  Gastrointestinal: Negative for abdominal pain and vomiting.  Genitourinary: Positive for dysuria. Negative for hematuria, vaginal bleeding and vaginal pain.  Musculoskeletal: Positive for myalgias. Negative for arthralgias and back pain.  Skin: Negative for color change and rash.  Neurological: Negative for seizures and syncope.  All other systems reviewed and are negative.    Physical Exam Updated Vital Signs  ED Triage Vitals [03/18/18 2025]  Enc Vitals Group     BP      Pulse      Resp      Temp      Temp src  SpO2      Weight      Height      Head Circumference      Peak Flow      Pain Score 7     Pain Loc      Pain Edu?      Excl. in GC?     Physical Exam Vitals signs and nursing note reviewed.  Constitutional:      General: She is not in acute distress.    Appearance: She is well-developed. She is not ill-appearing.  HENT:     Head: Normocephalic and atraumatic.     Right Ear: Tympanic membrane normal.     Left Ear: Tympanic membrane normal.     Nose: Nose normal.     Mouth/Throat:     Mouth: Mucous membranes are moist.     Pharynx: Oropharynx is clear.  Eyes:     Extraocular Movements: Extraocular movements intact.     Conjunctiva/sclera: Conjunctivae normal.     Pupils: Pupils are equal, round, and reactive to light.  Neck:     Musculoskeletal: Normal range of motion and neck supple.  Cardiovascular:     Rate and Rhythm: Normal rate and regular rhythm.     Pulses: Normal pulses.     Heart sounds: Normal heart sounds. No murmur.  Pulmonary:      Effort: Pulmonary effort is normal. No respiratory distress.     Breath sounds: Normal breath sounds.  Abdominal:     General: Abdomen is flat. There is no distension.     Palpations: Abdomen is soft. There is no mass.     Tenderness: There is no abdominal tenderness. There is no guarding.     Hernia: No hernia is present.  Musculoskeletal: Normal range of motion.     Right lower leg: No edema.     Left lower leg: No edema.  Skin:    General: Skin is warm and dry.     Capillary Refill: Capillary refill takes less than 2 seconds.  Neurological:     General: No focal deficit present.     Mental Status: She is alert.  Psychiatric:        Mood and Affect: Mood normal.      ED Treatments / Results  Labs (all labs ordered are listed, but only abnormal results are displayed) Labs Reviewed  PREGNANCY, URINE  URINALYSIS, ROUTINE W REFLEX MICROSCOPIC    EKG EKG Interpretation  Date/Time:  Thursday March 18 2018 21:00:44 EST Ventricular Rate:  97 PR Interval:    QRS Duration: 78 QT Interval:  329 QTC Calculation: 418 R Axis:   53 Text Interpretation:  Sinus rhythm Baseline wander in lead(s) V3 Confirmed by Virgina Norfolk [phone]) on 03/18/2018 9:04:06 PM   Radiology Dg Chest 2 View  Result Date: 03/18/2018 CLINICAL DATA:  Left-sided chest pain and body aches since yesterday. EXAM: CHEST - 2 VIEW COMPARISON:  08/15/2017 FINDINGS: The heart size and mediastinal contours are within normal limits. Both lungs are clear. The visualized skeletal structures are unremarkable. IMPRESSION: No active cardiopulmonary disease. Electronically Signed   By: Tollie Eth M.D.   On: 03/18/2018 20:53    Procedures Procedures (including critical care time)  Medications Ordered in ED Medications  acetaminophen (TYLENOL) tablet 650 mg (has no administration in time range)     Initial Impression / Assessment and Plan / ED Course  I have reviewed the triage vital signs and the nursing  notes.  Pertinent labs &  imaging results that were available during my care of the patient were reviewed by me and considered in my medical decision making (see chart for details).     Madison Weiss is a 28 year old female with history of asthma, depression, chronic back pain, anxiety who presents to the ED with URI symptoms.  Patient with normal vitals.  No fever.  Patient with body aches for the last 2 days.  Has had some pain with urination.  No abdominal tenderness, no shortness of breath.  Has had cough but no sputum production.  Patient with EKG that shows sinus rhythm.  Doubt cardiac process.  No risk factors for heart disease. PERC negative. Doubt PE.  Likely viral process.  Chest x-ray showed no signs of pneumonia, pneumothorax, pleural effusion.  Urinalysis showed no signs of infection.  Pregnancy test was negative.  Patient with component of anxiety as well.  But overall likely with viral process.  Recommend continued use of Tylenol Motrin at home.  Recommend increase hydration.  Recommend follow-up with primary care doctor as well and told to return to the ED symptoms worsen.  This chart was dictated using voice recognition software.  Despite best efforts to proofread,  errors can occur which can change the documentation meaning.    Final Clinical Impressions(s) / ED Diagnoses   Final diagnoses:  Viral syndrome    ED Discharge Orders    None       Virgina NorfolkCuratolo, Meghna Hagmann, DO 03/18/18 2107

## 2018-03-18 NOTE — ED Triage Notes (Signed)
Pt c/o 7/10 left cp and left body aches since yesterday.

## 2018-09-19 ENCOUNTER — Emergency Department (HOSPITAL_BASED_OUTPATIENT_CLINIC_OR_DEPARTMENT_OTHER): Payer: Medicaid Other

## 2018-09-19 ENCOUNTER — Encounter (HOSPITAL_BASED_OUTPATIENT_CLINIC_OR_DEPARTMENT_OTHER): Payer: Self-pay | Admitting: *Deleted

## 2018-09-19 ENCOUNTER — Emergency Department (HOSPITAL_BASED_OUTPATIENT_CLINIC_OR_DEPARTMENT_OTHER)
Admission: EM | Admit: 2018-09-19 | Discharge: 2018-09-19 | Disposition: A | Discharge status: Home / Self Care | Payer: Medicaid Other | Attending: Emergency Medicine | Admitting: Emergency Medicine

## 2018-09-19 ENCOUNTER — Other Ambulatory Visit: Payer: Self-pay

## 2018-09-19 DIAGNOSIS — R072 Precordial pain: Secondary | ICD-10-CM | POA: Insufficient documentation

## 2018-09-19 DIAGNOSIS — F419 Anxiety disorder, unspecified: Secondary | ICD-10-CM | POA: Insufficient documentation

## 2018-09-19 DIAGNOSIS — I499 Cardiac arrhythmia, unspecified: Secondary | ICD-10-CM | POA: Diagnosis not present

## 2018-09-19 DIAGNOSIS — R0789 Other chest pain: Secondary | ICD-10-CM | POA: Diagnosis present

## 2018-09-19 DIAGNOSIS — J45909 Unspecified asthma, uncomplicated: Secondary | ICD-10-CM | POA: Diagnosis not present

## 2018-09-19 LAB — CBC WITH DIFFERENTIAL/PLATELET
Abs Immature Granulocytes: 0.03 10*3/uL (ref 0.00–0.07)
Basophils Absolute: 0 10*3/uL (ref 0.0–0.1)
Basophils Relative: 0 %
Eosinophils Absolute: 0.1 10*3/uL (ref 0.0–0.5)
Eosinophils Relative: 1 %
HCT: 39.9 % (ref 36.0–46.0)
Hemoglobin: 12.4 g/dL (ref 12.0–15.0)
Immature Granulocytes: 0 %
Lymphocytes Relative: 24 %
Lymphs Abs: 3 10*3/uL (ref 0.7–4.0)
MCH: 25 pg — ABNORMAL LOW (ref 26.0–34.0)
MCHC: 31.1 g/dL (ref 30.0–36.0)
MCV: 80.4 fL (ref 80.0–100.0)
Monocytes Absolute: 0.9 10*3/uL (ref 0.1–1.0)
Monocytes Relative: 8 %
Neutro Abs: 8.3 10*3/uL — ABNORMAL HIGH (ref 1.7–7.7)
Neutrophils Relative %: 67 %
Platelets: 288 10*3/uL (ref 150–400)
RBC: 4.96 MIL/uL (ref 3.87–5.11)
RDW: 14.2 % (ref 11.5–15.5)
WBC: 12.4 10*3/uL — ABNORMAL HIGH (ref 4.0–10.5)
nRBC: 0 % (ref 0.0–0.2)

## 2018-09-19 LAB — BASIC METABOLIC PANEL
Anion gap: 11 (ref 5–15)
BUN: 15 mg/dL (ref 6–20)
CO2: 23 mmol/L (ref 22–32)
Calcium: 9.2 mg/dL (ref 8.9–10.3)
Chloride: 103 mmol/L (ref 98–111)
Creatinine, Ser: 0.78 mg/dL (ref 0.44–1.00)
GFR calc Af Amer: >60 mL/min (ref >60)
GFR calc non Af Amer: >60 mL/min (ref >60)
Glucose, Bld: 96 mg/dL (ref 70–99)
Potassium: 3.5 mmol/L (ref 3.5–5.1)
Sodium: 137 mmol/L (ref 135–145)

## 2018-09-19 LAB — PREGNANCY, URINE: Preg Test, Ur: NEGATIVE

## 2018-09-19 MED ORDER — ALUM & MAG HYDROXIDE-SIMETH 200-200-20 MG/5ML PO SUSP
30.0000 mL | Freq: Once | ORAL | Status: AC
  Administered 2018-09-19: 30 mL via ORAL
  Filled 2018-09-19: qty 30

## 2018-09-19 MED ORDER — OMEPRAZOLE 20 MG PO CPDR: 20.0000 mg | DELAYED_RELEASE_CAPSULE | Freq: Every day | ORAL | 0 refills | Status: AC

## 2018-09-19 MED ORDER — NAPROXEN 500 MG PO TABS: 500.0000 mg | ORAL_TABLET | Freq: Two times a day (BID) | ORAL | 0 refills | Status: AC

## 2018-09-19 MED ORDER — IOHEXOL 350 MG/ML SOLN
75.0000 mL | Freq: Once | INTRAVENOUS | Status: AC | PRN
  Administered 2018-09-19: 75 mL via INTRAVENOUS

## 2018-09-19 MED ORDER — LIDOCAINE VISCOUS HCL 2 % MT SOLN: 15.0000 mL | Freq: Once | OROMUCOSAL | Status: DC

## 2018-09-19 MED ORDER — KETOROLAC TROMETHAMINE 30 MG/ML IJ SOLN
15.0000 mg | Freq: Once | INTRAMUSCULAR | Status: AC
  Administered 2018-09-19: 15 mg via INTRAVENOUS
  Filled 2018-09-19: qty 1

## 2018-09-19 NOTE — ED Notes (Signed)
Pt attempting urine sample 

## 2018-09-19 NOTE — ED Notes (Signed)
Pt understood dc material. NAD noted. Scripts sent in electronically. All questions answered to satisfaction. Pt and family member escorted to check out counter. 

## 2018-09-19 NOTE — ED Notes (Signed)
Pt ambulated to restroom without assistance. NAD noted 

## 2018-09-19 NOTE — ED Notes (Signed)
Patient transported to CT 

## 2018-09-19 NOTE — ED Provider Notes (Signed)
[facility] Provider Note   CSN: [id number] Arrival date & time: 09/19/18  0015     History   Chief Complaint Chief Complaint  Patient presents with   Chest Pain    HPI Madison Weiss is a 28 y.o. female.     The history is provided by the patient.  Chest Pain Pain location:  L chest Pain quality: sharp   Pain radiates to:  Does not radiate Pain severity:  Severe Onset quality:  Gradual Timing:  Constant Progression:  Unchanged Chronicity:  Recurrent Context: at rest   Context: not breathing, not drug use, not eating, not intercourse, not lifting, not movement, not raising an arm and not trauma   Relieved by:  Nothing Worsened by:  Nothing Ineffective treatments:  None tried Associated symptoms: no abdominal pain, no AICD problem, no altered mental status, no anorexia, no anxiety, no back pain, no claudication, no cough, no diaphoresis, no dizziness, no dysphagia, no fatigue, no fever, no headache, no heartburn, no lower extremity edema, no nausea, no near-syncope, no numbness, no orthopnea, no palpitations, no PND, no shortness of breath, no syncope, no vomiting and no weakness   Risk factors: obesity   Risk factors: no aortic disease and no prior DVT/PE   Patient states she had this before and was anxiety or GERD.  No SOB.  No DOE.  No leg pain.  No f/c/r.    Past Medical History:  Diagnosis Date   Anxiety    Arrhythmia    currently seeing cardiologist   Asthma    Albuterol   Bell's palsy    Chronic back pain    d/t "the size of my boobs"   Depression    Hx of chlamydia infection    treated   Hx of trichomoniasis    treated   Obesity    PIH (pregnancy induced hypertension) 10/03/2014    Patient Active Problem List   Diagnosis Date Noted   S/P cesarean section 10/17/2014   Pre-eclampsia, antepartum 10/13/2014   PIH (pregnancy induced hypertension) 10/03/2014   [redacted] weeks gestation of pregnancy    Encounter  for (NT) nuchal translucency scan     Past Surgical History:  Procedure Laterality Date   CESAREAN SECTION N/A 10/17/2014   Procedure: CESAREAN SECTION;  Surgeon: Frederico Hamman, MD;  Location: [facility];  Service: Obstetrics;  Laterality: N/A;   NO PAST SURGERIES       OB History    Gravida  1   Para  1   Term      Preterm  1   AB      Living  1     SAB      TAB      Ectopic      Multiple  0   Live Births  1            Home Medications    Prior to Admission medications   Medication Sig Start Date End Date Taking? Authorizing Provider  naproxen (NAPROSYN) 500 MG tablet Take 1 tablet (500 mg total) by mouth 2 (two) times daily with a meal. 09/19/18   Wisam Siefring, MD  omeprazole (PRILOSEC) 20 MG capsule Take 1 capsule (20 mg total) by mouth daily. 09/19/18   Zia Najera, MD    Family History No family history on file.  Social History Social History   Tobacco Use   Smoking status: Never Smoker   Smokeless tobacco: Never Used  Substance Use  Topics   Alcohol use: Yes    Comment: occ   Drug use: No     Allergies   Patient has no known allergies.   Review of Systems Review of Systems  Constitutional: Negative for diaphoresis, fatigue and fever.  HENT: Negative for trouble swallowing.   Eyes: Negative for visual disturbance.  Respiratory: Negative for cough and shortness of breath.   Cardiovascular: Positive for chest pain. Negative for palpitations, orthopnea, claudication, leg swelling, syncope, PND and near-syncope.  Gastrointestinal: Negative for abdominal pain, anorexia, heartburn, nausea and vomiting.  Genitourinary: Negative for difficulty urinating.  Musculoskeletal: Negative for back pain.  Neurological: Negative for dizziness, weakness, numbness and headaches.  Psychiatric/Behavioral: Negative for agitation.  All other systems reviewed and are negative.    Physical Exam Updated Vital Signs BP (!) 127/95 (BP Location:  Left Arm)    Pulse 75    Temp 98.8 F (37.1 C) (Oral)    Resp 17    Ht 4\' 10"  (1.473 m)    Wt 98.4 kg    SpO2 100%    Breastfeeding No    BMI 45.35 kg/m   Physical Exam Vitals signs and nursing note reviewed.  Constitutional:      General: She is not in acute distress. HENT:     Head: Normocephalic and atraumatic.     Nose: Nose normal.  Eyes:     Conjunctiva/sclera: Conjunctivae normal.     Pupils: Pupils are equal, round, and reactive to light.  Neck:     Musculoskeletal: Normal range of motion and neck supple.  Cardiovascular:     Rate and Rhythm: Normal rate and regular rhythm.     Pulses: Normal pulses.     Heart sounds: Normal heart sounds.  Pulmonary:     Effort: Pulmonary effort is normal.     Breath sounds: Normal breath sounds.  Abdominal:     General: Abdomen is flat. Bowel sounds are normal.     Tenderness: There is no abdominal tenderness. There is no guarding.  Musculoskeletal: Normal range of motion.  Skin:    General: Skin is warm and dry.     Capillary Refill: Capillary refill takes less than 2 seconds.  Neurological:     General: No focal deficit present.     Mental Status: She is alert and oriented to person, place, and time.     Deep Tendon Reflexes: Reflexes normal.  Psychiatric:        Mood and Affect: Mood is anxious.      ED Treatments / Results  Labs (all labs ordered are listed, but only abnormal results are displayed) Results for orders placed or performed during the hospital encounter of 09/19/18  CBC with Differential/Platelet  Result Value Ref Range   WBC 12.4 (H) 4.0 - 10.5 K/uL   RBC 4.96 3.87 - 5.11 MIL/uL   Hemoglobin 12.4 12.0 - 15.0 g/dL   HCT 96.039.9 45.436.0 - 09.846.0 %   MCV 80.4 80.0 - 100.0 fL   MCH 25.0 (L) 26.0 - 34.0 pg   MCHC 31.1 30.0 - 36.0 g/dL   RDW 11.914.2 14.711.5 - 82.915.5 %   Platelets 288 150 - 400 K/uL   nRBC 0.0 0.0 - 0.2 %   Neutrophils Relative % 67 %   Neutro Abs 8.3 (H) 1.7 - 7.7 K/uL   Lymphocytes Relative 24 %    Lymphs Abs 3.0 0.7 - 4.0 K/uL   Monocytes Relative 8 %   Monocytes Absolute 0.9 0.1 -  1.0 K/uL   Eosinophils Relative 1 %   Eosinophils Absolute 0.1 0.0 - 0.5 K/uL   Basophils Relative 0 %   Basophils Absolute 0.0 0.0 - 0.1 K/uL   Immature Granulocytes 0 %   Abs Immature Granulocytes 0.03 0.00 - 0.07 K/uL  Basic metabolic panel  Result Value Ref Range   Sodium 137 135 - 145 mmol/L   Potassium 3.5 3.5 - 5.1 mmol/L   Chloride 103 98 - 111 mmol/L   CO2 23 22 - 32 mmol/L   Glucose, Bld 96 70 - 99 mg/dL   BUN 15 6 - 20 mg/dL   Creatinine, Ser 1.610.78 0.44 - 1.00 mg/dL   Calcium 9.2 8.9 - 09.610.3 mg/dL   GFR calc non Af Amer >60 >60 mL/min   GFR calc Af Amer >60 >60 mL/min   Anion gap 11 5 - 15  Pregnancy, urine  Result Value Ref Range   Preg Test, Ur NEGATIVE NEGATIVE   Ct Angio Chest Pe W And/or Wo Contrast  Result Date: 09/19/2018 CLINICAL DATA:  Two days of chest pain. EXAM: CT ANGIOGRAPHY CHEST WITH CONTRAST TECHNIQUE: Multidetector CT imaging of the chest was performed using the standard protocol during bolus administration of intravenous contrast. Multiplanar CT image reconstructions and MIPs were obtained to evaluate the vascular anatomy. CONTRAST:  75mL OMNIPAQUE IOHEXOL 350 MG/ML SOLN COMPARISON:  August 12, 2014. FINDINGS: Cardiovascular: Evaluation is significantly limited by respiratory motion artifact. Given this limitation, there was no definite PE. Heart size is relatively normal. There is no significant pericardial effusion. Mediastinum/Nodes: --No mediastinal or hilar lymphadenopathy. --No axillary lymphadenopathy. --No supraclavicular lymphadenopathy. --Normal thyroid gland. --The esophagus is unremarkable Lungs/Pleura: No pulmonary nodules or masses. No pleural effusion or pneumothorax. No focal airspace consolidation. No focal pleural abnormality. Upper Abdomen: No acute abnormality. Musculoskeletal: No chest wall abnormality. No acute or significant osseous findings. Review of the  MIP images confirms the above findings. IMPRESSION: 1. Limited study secondary to motion artifact. 2. No PE given the limitations described above. 3. No acute abnormality detected to explain the patient's symptoms. Electronically Signed   By: Katherine Mantlehristopher  Green M.D.   On: 09/19/2018 02:41    EKG EKG Interpretation  Date/Time:  Sunday September 19 2018 00:27:30 EDT Ventricular Rate:  121 PR Interval:    QRS Duration: 75 QT Interval:  310 QTC Calculation: 440 R Axis:   51 Text Interpretation:  Sinus tachycardia Confirmed by Eran Windish ([id number]) on 09/19/2018 2:55:42 AM   Radiology Ct Angio Chest Pe W And/or Wo Contrast  Result Date: 09/19/2018 CLINICAL DATA:  Two days of chest pain. EXAM: CT ANGIOGRAPHY CHEST WITH CONTRAST TECHNIQUE: Multidetector CT imaging of the chest was performed using the standard protocol during bolus administration of intravenous contrast. Multiplanar CT image reconstructions and MIPs were obtained to evaluate the vascular anatomy. CONTRAST:  75mL OMNIPAQUE IOHEXOL 350 MG/ML SOLN COMPARISON:  August 12, 2014. FINDINGS: Cardiovascular: Evaluation is significantly limited by respiratory motion artifact. Given this limitation, there was no definite PE. Heart size is relatively normal. There is no significant pericardial effusion. Mediastinum/Nodes: --No mediastinal or hilar lymphadenopathy. --No axillary lymphadenopathy. --No supraclavicular lymphadenopathy. --Normal thyroid gland. --The esophagus is unremarkable Lungs/Pleura: No pulmonary nodules or masses. No pleural effusion or pneumothorax. No focal airspace consolidation. No focal pleural abnormality. Upper Abdomen: No acute abnormality. Musculoskeletal: No chest wall abnormality. No acute or significant osseous findings. Review of the MIP images confirms the above findings. IMPRESSION: 1. Limited study secondary to motion artifact. 2. No  PE given the limitations described above. 3. No acute abnormality detected to explain the  patient's symptoms. Electronically Signed   By: Katherine Mantlehristopher  Green M.D.   On: 09/19/2018 02:41    Procedures Procedures (including critical care time)  Medications Ordered in ED Medications  alum & mag hydroxide-simeth (MAALOX/MYLANTA) 200-200-20 MG/5ML suspension 30 mL (30 mLs Oral Given 09/19/18 0119)  iohexol (OMNIPAQUE) 350 MG/ML injection 75 mL (75 mLs Intravenous Contrast Given 09/19/18 0201)  ketorolac (TORADOL) 30 MG/ML injection 15 mg (15 mg Intravenous Given 09/19/18 0309)    HEART score is 0, I do not believe this is cardiac in nature.  Symptoms are highly atypical.  She has had this same thing in the past and it was gerd and anxiety but she is off her medications.    Given elevated HR on EKG, scanned for PE though patient is low risk.  Improved significantly with GI cocktail.  Also appears less anxious.  I suspect this is a combination of gerd with superimposed anxiety.  She is markedly improved.  She is stable for discharge.   Dalene CarrowMorgan Bevill was evaluated in Emergency Department on 09/19/2018 for the symptoms described in the history of present illness. She was evaluated in the context of the global COVID-19 pandemic, which necessitated consideration that the patient might be at risk for infection with the SARS-CoV-2 virus that causes COVID-19. Institutional protocols and algorithms that pertain to the evaluation of patients at risk for COVID-19 are in a state of rapid change based on information released by regulatory bodies including the CDC and federal and state organizations. These policies and algorithms were followed during the patient's care in the ED.   Final Clinical Impressions(s) / ED Diagnoses   Final diagnoses:  Precordial pain  Anxiety   Return for intractable cough, coughing up blood,fevers >100.4 unrelieved by medication, shortness of breath, intractable vomiting, chest pain, shortness of breath, weakness,numbness, changes in speech, facial  asymmetry,abdominal pain, passing out,Inability to tolerate liquids or food, cough, altered mental status or any concerns. No signs of systemic illness or infection. The patient is nontoxic-appearing on exam and vital signs are within normal limits.   I have reviewed the triage vital signs and the nursing notes. Pertinent labs &imaging results that were available during my care of the patient were reviewed by me and considered in my medical decision making (see chart for details).  After history, exam, and medical workup I feel the patient has been appropriately medically screened and is safe for discharge home. Pertinent diagnoses were discussed with the patient. Patient was given return precautions ED Discharge Orders         Ordered    omeprazole (PRILOSEC) 20 MG capsule  Daily     09/19/18 0300    naproxen (NAPROSYN) 500 MG tablet  2 times daily with meals     09/19/18 0300           Season Astacio, MD 09/19/18 [id number]

## 2018-09-19 NOTE — ED Triage Notes (Signed)
Pt reports 2 days of chest pain. Also hx of anxiety. States she has been having palpitations

## 2018-10-02 ENCOUNTER — Other Ambulatory Visit: Payer: Self-pay

## 2018-10-02 ENCOUNTER — Emergency Department (HOSPITAL_BASED_OUTPATIENT_CLINIC_OR_DEPARTMENT_OTHER)
Admission: EM | Admit: 2018-10-02 | Discharge: 2018-10-02 | Disposition: A | Discharge status: Home / Self Care | Payer: Medicaid Other | Attending: Emergency Medicine | Admitting: Emergency Medicine

## 2018-10-02 ENCOUNTER — Encounter (HOSPITAL_BASED_OUTPATIENT_CLINIC_OR_DEPARTMENT_OTHER): Payer: Self-pay | Admitting: Emergency Medicine

## 2018-10-02 DIAGNOSIS — J45909 Unspecified asthma, uncomplicated: Secondary | ICD-10-CM | POA: Diagnosis not present

## 2018-10-02 DIAGNOSIS — F419 Anxiety disorder, unspecified: Secondary | ICD-10-CM | POA: Diagnosis present

## 2018-10-02 NOTE — ED Provider Notes (Signed)
[facility] EMERGENCY DEPARTMENT Provider Note   CSN: [id number] Arrival date & time: 10/02/18  0449     History   Chief Complaint No chief complaint on file.   HPI Madison Weiss is a 28 y.o. female.     The history is provided by the patient.  Anxiety This is a recurrent problem. The current episode started less than 1 hour ago. The problem occurs constantly. The problem has not changed since onset.Pertinent negatives include no chest pain, no abdominal pain, no headaches and no shortness of breath. Nothing aggravates the symptoms. Nothing relieves the symptoms. She has tried nothing for the symptoms. The treatment provided no relief.  Drinking alcohol and got into an argument and felt anxious.  Is off her medication for anxiety.  No CP no SOB.  No leg pain.    Past Medical History:  Diagnosis Date  . Anxiety   . Arrhythmia    currently seeing cardiologist  . Asthma    Albuterol  . Bell's palsy   . Chronic back pain    d/t "the size of my boobs"  . Depression   . Hx of chlamydia infection    treated  . Hx of trichomoniasis    treated  . Obesity   . PIH (pregnancy induced hypertension) 10/03/2014    Patient Active Problem List   Diagnosis Date Noted  . S/P cesarean section 10/17/2014  . Pre-eclampsia, antepartum 10/13/2014  . PIH (pregnancy induced hypertension) 10/03/2014  . [redacted] weeks gestation of pregnancy   . Encounter for (NT) nuchal translucency scan     Past Surgical History:  Procedure Laterality Date  . CESAREAN SECTION N/A 10/17/2014   Procedure: CESAREAN SECTION;  Surgeon: Frederico Hamman, MD;  Location: [facility];  Service: Obstetrics;  Laterality: N/A;  . NO PAST SURGERIES       OB History    Gravida  1   Para  1   Term      Preterm  1   AB      Living  1     SAB      TAB      Ectopic      Multiple  0   Live Births  1            Home Medications    Prior to Admission medications   Medication Sig Start Date  End Date Taking? Authorizing Provider  naproxen (NAPROSYN) 500 MG tablet Take 1 tablet (500 mg total) by mouth 2 (two) times daily with a meal. 09/19/18   Angeliyah Kirkey, MD  omeprazole (PRILOSEC) 20 MG capsule Take 1 capsule (20 mg total) by mouth daily. 09/19/18   Gor Vestal, MD    Family History No family history on file.  Social History Social History   Tobacco Use  . Smoking status: Never Smoker  . Smokeless tobacco: Never Used  Substance Use Topics  . Alcohol use: Yes    Comment: occ  . Drug use: No     Allergies   Patient has no known allergies.   Review of Systems Review of Systems  Constitutional: Negative for fever.  HENT: Negative for congestion.   Eyes: Negative for visual disturbance.  Respiratory: Negative for cough and shortness of breath.   Cardiovascular: Negative for chest pain and leg swelling.  Gastrointestinal: Negative for abdominal pain.  Genitourinary: Negative for difficulty urinating.  Musculoskeletal: Negative for arthralgias.  Skin: Negative for rash.  Neurological: Negative for headaches.  Psychiatric/Behavioral:  The patient is nervous/anxious.   All other systems reviewed and are negative.    Physical Exam Updated Vital Signs There were no vitals taken for this visit.  Physical Exam Vitals signs and nursing note reviewed.  Constitutional:      General: She is not in acute distress.    Appearance: She is obese.  HENT:     Head: Normocephalic and atraumatic.     Nose: Nose normal.  Eyes:     Conjunctiva/sclera: Conjunctivae normal.     Pupils: Pupils are equal, round, and reactive to light.  Neck:     Musculoskeletal: Normal range of motion and neck supple.  Cardiovascular:     Rate and Rhythm: Normal rate and regular rhythm.     Pulses: Normal pulses.     Heart sounds: Normal heart sounds.  Pulmonary:     Effort: Pulmonary effort is normal.     Breath sounds: Normal breath sounds.  Abdominal:     General: Abdomen is  flat. Bowel sounds are normal.     Tenderness: There is no abdominal tenderness. There is no guarding.  Musculoskeletal: Normal range of motion.  Skin:    General: Skin is warm and dry.     Capillary Refill: Capillary refill takes less than 2 seconds.  Neurological:     General: No focal deficit present.     Mental Status: She is alert and oriented to person, place, and time.     Deep Tendon Reflexes: Reflexes normal.  Psychiatric:        Mood and Affect: Mood is anxious.      ED Treatments / Results  Labs (all labs ordered are listed, but only abnormal results are displayed) Labs Reviewed - No data to display  EKG None  Radiology No results found.  Procedures Procedures (including critical care time)  Medications Ordered in ED Medications - No data to display   Seen by me for similar last week.  Ruled out for MI and PE at that time.  There is no indication for repeat labs or imaging.  Patient is clearly anxious.  Has had lab work within the past 2 days no indication for more at this time.  Counseled about no alcohol and staying out of stressful situations.   Final Clinical Impressions(s) / ED Diagnoses   Return for intractable cough, coughing up blood,fevers >100.4 unrelieved by medication, shortness of breath, intractable vomiting, chest pain, shortness of breath, weakness,numbness, changes in speech, facial asymmetry,abdominal pain, passing out,Inability to tolerate liquids or food, cough, altered mental status or any concerns. No signs of systemic illness or infection. The patient is nontoxic-appearing on exam and vital signs are within normal limits.   I have reviewed the triage vital signs and the nursing notes. Pertinent labs &imaging results that were available during my care of the patient were reviewed by me and considered in my medical decision making (see chart for details).  After history, exam, and medical workup I feel the patient has been  appropriately medically screened and is safe for discharge home. Pertinent diagnoses were discussed with the patient. Patient was given return precautions   Tashyra Adduci, MD 10/02/18 [id number]

## 2018-10-02 NOTE — ED Triage Notes (Addendum)
Has been having palpations for 2 days and anxiety . Worse tonight during a "heated discussion" , denies chest pain. Admits to ETOH tonight

## 2018-10-02 NOTE — ED Notes (Signed)
Given po fluids 

## 2018-10-12 ENCOUNTER — Other Ambulatory Visit: Payer: Self-pay

## 2018-10-12 ENCOUNTER — Emergency Department (HOSPITAL_BASED_OUTPATIENT_CLINIC_OR_DEPARTMENT_OTHER): Payer: Medicaid Other

## 2018-10-12 ENCOUNTER — Emergency Department (HOSPITAL_BASED_OUTPATIENT_CLINIC_OR_DEPARTMENT_OTHER)
Admission: EM | Admit: 2018-10-12 | Discharge: 2018-10-12 | Disposition: A | Discharge status: Home / Self Care | Payer: Medicaid Other | Attending: Emergency Medicine | Admitting: Emergency Medicine

## 2018-10-12 ENCOUNTER — Encounter (HOSPITAL_BASED_OUTPATIENT_CLINIC_OR_DEPARTMENT_OTHER): Payer: Self-pay | Admitting: Emergency Medicine

## 2018-10-12 DIAGNOSIS — Z79899 Other long term (current) drug therapy: Secondary | ICD-10-CM | POA: Diagnosis not present

## 2018-10-12 DIAGNOSIS — J45909 Unspecified asthma, uncomplicated: Secondary | ICD-10-CM | POA: Insufficient documentation

## 2018-10-12 DIAGNOSIS — R0789 Other chest pain: Secondary | ICD-10-CM | POA: Diagnosis not present

## 2018-10-12 LAB — CBC WITH DIFFERENTIAL/PLATELET
Abs Immature Granulocytes: 0.03 10*3/uL (ref 0.00–0.07)
Basophils Absolute: 0 10*3/uL (ref 0.0–0.1)
Basophils Relative: 0 %
Eosinophils Absolute: 0.1 10*3/uL (ref 0.0–0.5)
Eosinophils Relative: 1 %
HCT: 37.4 % (ref 36.0–46.0)
Hemoglobin: 11.6 g/dL — ABNORMAL LOW (ref 12.0–15.0)
Immature Granulocytes: 0 %
Lymphocytes Relative: 22 %
Lymphs Abs: 2.4 10*3/uL (ref 0.7–4.0)
MCH: 25.1 pg — ABNORMAL LOW (ref 26.0–34.0)
MCHC: 31 g/dL (ref 30.0–36.0)
MCV: 80.8 fL (ref 80.0–100.0)
Monocytes Absolute: 1.1 10*3/uL — ABNORMAL HIGH (ref 0.1–1.0)
Monocytes Relative: 10 %
Neutro Abs: 7.4 10*3/uL (ref 1.7–7.7)
Neutrophils Relative %: 67 %
Platelets: 304 10*3/uL (ref 150–400)
RBC: 4.63 MIL/uL (ref 3.87–5.11)
RDW: 13.9 % (ref 11.5–15.5)
WBC: 11 10*3/uL — ABNORMAL HIGH (ref 4.0–10.5)
nRBC: 0 % (ref 0.0–0.2)

## 2018-10-12 LAB — BASIC METABOLIC PANEL
Anion gap: 9 (ref 5–15)
BUN: 11 mg/dL (ref 6–20)
CO2: 23 mmol/L (ref 22–32)
Calcium: 8.9 mg/dL (ref 8.9–10.3)
Chloride: 108 mmol/L (ref 98–111)
Creatinine, Ser: 0.77 mg/dL (ref 0.44–1.00)
GFR calc Af Amer: >60 mL/min (ref >60)
GFR calc non Af Amer: >60 mL/min (ref >60)
Glucose, Bld: 120 mg/dL — ABNORMAL HIGH (ref 70–99)
Potassium: 3.7 mmol/L (ref 3.5–5.1)
Sodium: 140 mmol/L (ref 135–145)

## 2018-10-12 LAB — TROPONIN I (HIGH SENSITIVITY): Troponin I (High Sensitivity): 2 ng/L (ref <18)

## 2018-10-12 LAB — PREGNANCY, URINE: Preg Test, Ur: NEGATIVE

## 2018-10-12 MED ORDER — ALUM & MAG HYDROXIDE-SIMETH 200-200-20 MG/5ML PO SUSP: 30.0000 mL | Freq: Once | ORAL | Status: DC

## 2018-10-12 MED ORDER — LIDOCAINE VISCOUS HCL 2 % MT SOLN: 15.0000 mL | Freq: Once | OROMUCOSAL | Status: DC

## 2018-10-12 MED ORDER — KETOROLAC TROMETHAMINE 15 MG/ML IJ SOLN
15.0000 mg | Freq: Once | INTRAMUSCULAR | Status: AC
  Administered 2018-10-12: 15 mg via INTRAVENOUS

## 2018-10-12 MED ORDER — IOHEXOL 350 MG/ML SOLN
100.0000 mL | Freq: Once | INTRAVENOUS | Status: AC | PRN
  Administered 2018-10-12: 100 mL via INTRAVENOUS

## 2018-10-12 MED ORDER — IBUPROFEN 800 MG PO TABS: 800.0000 mg | ORAL_TABLET | Freq: Three times a day (TID) | ORAL | 0 refills | Status: AC

## 2018-10-12 MED ORDER — KETOROLAC TROMETHAMINE 60 MG/2ML IM SOLN
30.0000 mg | Freq: Once | INTRAMUSCULAR | Status: DC
  Filled 2018-10-12: qty 2

## 2018-10-12 NOTE — ED Triage Notes (Signed)
Pt c/o tightness in chest, left arm pain and shob. Pt has been seen previously for same.

## 2018-10-12 NOTE — ED Provider Notes (Addendum)
[facility] EMERGENCY DEPARTMENT Provider Note   CSN: [id number] Arrival date & time: 10/12/18  0536     History   Chief Complaint Chief Complaint  Patient presents with  . Chest Pain    HPI Madison Weiss is a 28 y.o. female.     The history is provided by the patient.  Chest Pain Pain location:  Substernal area Pain quality: sharp   Pain radiates to:  Does not radiate Pain severity:  Moderate Onset quality:  Gradual Duration:  1 day Timing:  Constant Progression:  Unchanged Chronicity:  Recurrent Context: raising an arm and at rest   Context: not drug use   Relieved by:  Nothing Worsened by:  Nothing Ineffective treatments:  None tried Associated symptoms: anxiety   Associated symptoms: no abdominal pain, no altered mental status, no anorexia, no back pain, no claudication, no cough, no diaphoresis, no dizziness, no fever, no headache, no lower extremity edema, no shortness of breath, no syncope and no vomiting   Risk factors: no aortic disease   Patient well known to me for same.  No DOE.  No SOB.  No f/c/r.    Past Medical History:  Diagnosis Date  . Anxiety   . Arrhythmia    currently seeing cardiologist  . Asthma    Albuterol  . Bell's palsy   . Chronic back pain    d/t "the size of my boobs"  . Depression   . Hx of chlamydia infection    treated  . Hx of trichomoniasis    treated  . Obesity   . PIH (pregnancy induced hypertension) 10/03/2014    Patient Active Problem List   Diagnosis Date Noted  . S/P cesarean section 10/17/2014  . Pre-eclampsia, antepartum 10/13/2014  . PIH (pregnancy induced hypertension) 10/03/2014  . [redacted] weeks gestation of pregnancy   . Encounter for (NT) nuchal translucency scan     Past Surgical History:  Procedure Laterality Date  . CESAREAN SECTION N/A 10/17/2014   Procedure: CESAREAN SECTION;  Surgeon: Kathreen Cosier, MD;  Location: [facility];  Service: Obstetrics;  Laterality: N/A;  . NO PAST SURGERIES        OB History    Gravida  1   Para  1   Term      Preterm  1   AB      Living  1     SAB      TAB      Ectopic      Multiple  0   Live Births  1            Home Medications    Prior to Admission medications   Medication Sig Start Date End Date Taking? Authorizing Provider  naproxen (NAPROSYN) 500 MG tablet Take 1 tablet (500 mg total) by mouth 2 (two) times daily with a meal. 09/19/18   Johna Kearl, MD  omeprazole (PRILOSEC) 20 MG capsule Take 1 capsule (20 mg total) by mouth daily. 09/19/18   Lonnie Reth, MD    Family History No family history on file.  Social History Social History   Tobacco Use  . Smoking status: Never Smoker  . Smokeless tobacco: Never Used  Substance Use Topics  . Alcohol use: Yes    Comment: occ  . Drug use: No     Allergies   Patient has no known allergies.   Review of Systems Review of Systems  Constitutional: Negative for diaphoresis and fever.  HENT: Negative  for congestion.   Eyes: Negative for visual disturbance.  Respiratory: Negative for cough, chest tightness and shortness of breath.   Cardiovascular: Positive for chest pain. Negative for claudication, leg swelling and syncope.  Gastrointestinal: Negative for abdominal pain, anorexia and vomiting.  Genitourinary: Negative for difficulty urinating.  Musculoskeletal: Negative for arthralgias and back pain.  Neurological: Negative for dizziness and headaches.  Psychiatric/Behavioral: Negative for agitation.  All other systems reviewed and are negative.    Physical Exam Updated Vital Signs BP 118/85   Pulse 94   Temp 98.1 F (36.7 C)   Resp 16   Ht 4\' 10"  (1.473 m)   Wt 98.4 kg   LMP 09/24/2018   SpO2 100%   BMI 45.35 kg/m   Physical Exam Vitals signs and nursing note reviewed.  Constitutional:      General: She is not in acute distress.    Appearance: She is obese.  HENT:     Head: Normocephalic and atraumatic.     Nose: Nose normal.   Eyes:     Conjunctiva/sclera: Conjunctivae normal.     Pupils: Pupils are equal, round, and reactive to light.  Neck:     Musculoskeletal: Normal range of motion and neck supple.  Cardiovascular:     Rate and Rhythm: Normal rate and regular rhythm.     Pulses: Normal pulses.     Heart sounds: Normal heart sounds.  Pulmonary:     Effort: Pulmonary effort is normal.     Breath sounds: Normal breath sounds.  Chest:     Chest wall: Tenderness present.  Abdominal:     General: Abdomen is flat. Bowel sounds are normal.     Tenderness: There is no abdominal tenderness. There is no guarding.  Musculoskeletal: Normal range of motion.  Skin:    General: Skin is warm and dry.     Capillary Refill: Capillary refill takes less than 2 seconds.  Neurological:     General: No focal deficit present.     Mental Status: She is alert and oriented to person, place, and time.  Psychiatric:        Mood and Affect: Mood is anxious.      ED Treatments / Results  Labs (all labs ordered are listed, but only abnormal results are displayed) Results for orders placed or performed during the hospital encounter of 10/12/18  Pregnancy, urine  Result Value Ref Range   Preg Test, Ur NEGATIVE NEGATIVE  CBC with Differential/Platelet  Result Value Ref Range   WBC 11.0 (H) 4.0 - 10.5 K/uL   RBC 4.63 3.87 - 5.11 MIL/uL   Hemoglobin 11.6 (L) 12.0 - 15.0 g/dL   HCT 40.937.4 81.136.0 - 91.446.0 %   MCV 80.8 80.0 - 100.0 fL   MCH 25.1 (L) 26.0 - 34.0 pg   MCHC 31.0 30.0 - 36.0 g/dL   RDW 78.213.9 95.611.5 - 21.315.5 %   Platelets 304 150 - 400 K/uL   nRBC 0.0 0.0 - 0.2 %   Neutrophils Relative % 67 %   Neutro Abs 7.4 1.7 - 7.7 K/uL   Lymphocytes Relative 22 %   Lymphs Abs 2.4 0.7 - 4.0 K/uL   Monocytes Relative 10 %   Monocytes Absolute 1.1 (H) 0.1 - 1.0 K/uL   Eosinophils Relative 1 %   Eosinophils Absolute 0.1 0.0 - 0.5 K/uL   Basophils Relative 0 %   Basophils Absolute 0.0 0.0 - 0.1 K/uL   Immature Granulocytes 0 %    Abs  Immature Granulocytes 0.03 0.00 - 0.07 K/uL  Basic metabolic panel  Result Value Ref Range   Sodium 140 135 - 145 mmol/L   Potassium 3.7 3.5 - 5.1 mmol/L   Chloride 108 98 - 111 mmol/L   CO2 23 22 - 32 mmol/L   Glucose, Bld 120 (H) 70 - 99 mg/dL   BUN 11 6 - 20 mg/dL   Creatinine, Ser 0.77 0.44 - 1.00 mg/dL   Calcium 8.9 8.9 - 10.3 mg/dL   GFR calc non Af Amer >60 >60 mL/min   GFR calc Af Amer >60 >60 mL/min   Anion gap 9 5 - 15   Ct Angio Chest Pe W And/or Wo Contrast  Result Date: 09/19/2018 CLINICAL DATA:  Two days of chest pain. EXAM: CT ANGIOGRAPHY CHEST WITH CONTRAST TECHNIQUE: Multidetector CT imaging of the chest was performed using the standard protocol during bolus administration of intravenous contrast. Multiplanar CT image reconstructions and MIPs were obtained to evaluate the vascular anatomy. CONTRAST:  21mL OMNIPAQUE IOHEXOL 350 MG/ML SOLN COMPARISON:  August 12, 2014. FINDINGS: Cardiovascular: Evaluation is significantly limited by respiratory motion artifact. Given this limitation, there was no definite PE. Heart size is relatively normal. There is no significant pericardial effusion. Mediastinum/Nodes: --No mediastinal or hilar lymphadenopathy. --No axillary lymphadenopathy. --No supraclavicular lymphadenopathy. --Normal thyroid gland. --The esophagus is unremarkable Lungs/Pleura: No pulmonary nodules or masses. No pleural effusion or pneumothorax. No focal airspace consolidation. No focal pleural abnormality. Upper Abdomen: No acute abnormality. Musculoskeletal: No chest wall abnormality. No acute or significant osseous findings. Review of the MIP images confirms the above findings. IMPRESSION: 1. Limited study secondary to motion artifact. 2. No PE given the limitations described above. 3. No acute abnormality detected to explain the patient's symptoms. Electronically Signed   By: Constance Holster M.D.   On: 09/19/2018 02:41    EKG  EKG Interpretation  Date/Time:   Tuesday October 12 2018 05:57:59 EDT Ventricular Rate:  100 PR Interval:    QRS Duration: 78 QT Interval:  319 QTC Calculation: 412 R Axis:   31 Text Interpretation:  Sinus tachycardia Confirmed by Randal Buba, Zein Helbing ([id number]) on 10/12/2018 6:33:58 AM       Procedures Procedures (including critical care time)  Medications Ordered in ED Medications  ketorolac (TORADOL) 15 MG/ML injection 15 mg (has no administration in time range)  iohexol (OMNIPAQUE) 350 MG/ML injection 100 mL (100 mLs Intravenous Contrast Given 10/12/18 0620)     Initial Impression / Assessment and Plan / ED Course  I have reviewed the triage vital signs and the nursing notes.  Hear score is 0 very low risk for MACE.  Ruled out for PE in the ED, see CTA results.  Today, this is likely MSK as worse with movement with superimposed anxiety.  This is the 4th time I have seen this patient and I have encouraged her to follow up with a PMD as this is not cardiac in nature. Take tylenol and ibuprofen for pain and your home gerd and anxiety medications.    Danaka Llera was evaluated in Emergency Department on 10/12/2018 for the symptoms described in the history of present illness. She was evaluated in the context of the global COVID-19 pandemic, which necessitated consideration that the patient might be at risk for infection with the SARS-CoV-2 virus that causes COVID-19. Institutional protocols and algorithms that pertain to the evaluation of patients at risk for COVID-19 are in a state of rapid change based on information released by regulatory bodies including  the [employer] and federal and state organizations. These policies and algorithms were followed during the patient's care in the ED.   Final Clinical Impressions(s) / ED Diagnoses   Return for intractable cough, coughing up blood,fevers >100.4 unrelieved by medication, shortness of breath, intractable vomiting, chest pain, shortness of breath, weakness,numbness, changes in  speech, facial asymmetry,abdominal pain, passing out,Inability to tolerate liquids or food, cough, altered mental status or any concerns. No signs of systemic illness or infection. The patient is nontoxic-appearing on exam and vital signs are within normal limits.   I have reviewed the triage vital signs and the nursing notes. Pertinent labs &imaging results that were available during my care of the patient were reviewed by me and considered in my medical decision making (see chart for details).After history, exam, and medical workup I feel the patient has beenappropriately medically screened and is safe for discharge home. Pertinent diagnoses were discussed with the patient. Patient was given return precautions.         Melanni Benway, MD 10/12/18 [id number]

## 2018-10-31 ENCOUNTER — Other Ambulatory Visit: Payer: Self-pay

## 2018-10-31 ENCOUNTER — Encounter (HOSPITAL_BASED_OUTPATIENT_CLINIC_OR_DEPARTMENT_OTHER): Payer: Self-pay | Admitting: Emergency Medicine

## 2018-10-31 ENCOUNTER — Emergency Department (HOSPITAL_BASED_OUTPATIENT_CLINIC_OR_DEPARTMENT_OTHER)
Admission: EM | Admit: 2018-10-31 | Discharge: 2018-10-31 | Disposition: A | Discharge status: Home / Self Care | Payer: Medicaid Other | Attending: Emergency Medicine | Admitting: Emergency Medicine

## 2018-10-31 DIAGNOSIS — N390 Urinary tract infection, site not specified: Secondary | ICD-10-CM | POA: Insufficient documentation

## 2018-10-31 DIAGNOSIS — J029 Acute pharyngitis, unspecified: Secondary | ICD-10-CM

## 2018-10-31 DIAGNOSIS — J45909 Unspecified asthma, uncomplicated: Secondary | ICD-10-CM | POA: Insufficient documentation

## 2018-10-31 DIAGNOSIS — Z79899 Other long term (current) drug therapy: Secondary | ICD-10-CM | POA: Insufficient documentation

## 2018-10-31 LAB — GROUP A STREP BY PCR: Group A Strep by PCR: NOT DETECTED

## 2018-10-31 LAB — URINALYSIS, ROUTINE W REFLEX MICROSCOPIC
Bilirubin Urine: NEGATIVE
Glucose, UA: NEGATIVE mg/dL
Ketones, ur: NEGATIVE mg/dL
Leukocytes,Ua: NEGATIVE
Nitrite: POSITIVE — AB
Protein, ur: NEGATIVE mg/dL
Specific Gravity, Urine: 1.025 (ref 1.005–1.030)
pH: 7 (ref 5.0–8.0)

## 2018-10-31 LAB — URINALYSIS, MICROSCOPIC (REFLEX)

## 2018-10-31 LAB — PREGNANCY, URINE: Preg Test, Ur: NEGATIVE

## 2018-10-31 MED ORDER — AMOXICILLIN-POT CLAVULANATE 875-125 MG PO TABS: 1.0000 | ORAL_TABLET | Freq: Two times a day (BID) | ORAL | 0 refills | Status: AC

## 2018-10-31 MED ORDER — AMOXICILLIN-POT CLAVULANATE 875-125 MG PO TABS: 1.0000 | ORAL_TABLET | Freq: Two times a day (BID) | ORAL | 0 refills | Status: DC

## 2018-10-31 NOTE — ED Triage Notes (Addendum)
Pt reports pelvic pressure with urinary frequency since yesterday. Also c/o headache. Recently treated for strep throat and states sore throat has returned.

## 2018-10-31 NOTE — ED Provider Notes (Signed)
[facility] EMERGENCY DEPARTMENT Provider Note   CSN: [id number] Arrival date & time: 10/31/18  1442     History   Chief Complaint Chief Complaint  Patient presents with   Urinary Frequency   Headache   Sore Throat    HPI Madison Weiss is a 28 y.o. female.     The history is provided by the patient and medical records. No language interpreter was used.  Urinary Frequency Pertinent negatives include no abdominal pain and no shortness of breath.  Headache Associated symptoms: nausea and sore throat   Associated symptoms: no abdominal pain, no congestion, no cough, no diarrhea, no fever and no vomiting   Sore Throat Pertinent negatives include no abdominal pain and no shortness of breath.   Madison Weiss is a 28 y.o. female  with a PMH as listed below who presents to the Emergency Department with 2 complaints:  1.  Sore throat.  Patient states that she was diagnosed with strep about a week ago, however chart review shows she was actually seen on 9/17.  She took her antibiotics for the first 3 days.  She then felt better and quit taking them, then her symptoms began again, so she started taking them once again.  She states that she has taken all but 1 pill at this point.  She said that 2 days ago, her sore throat returned.  She does report that at her initial visit, she had gonorrhea and Chlamydia testing of her throat as well since she had recently had unprotected oral sex. Denies fever or chills.  No cough or congestion.  2.  Dysuria and urinary frequency which began yesterday.  Denies vaginal discharge and tells me that she had full pelvic exam with STD testing a couple of weeks ago.  Associated with mild low back pain which began today.  Has felt nauseous at times, but not currently.  No vomiting.  No fevers.   Past Medical History:  Diagnosis Date   Anxiety    Arrhythmia    currently seeing cardiologist   Asthma    Albuterol   Bell's palsy    Chronic  back pain    d/t "the size of my boobs"   Depression    Hx of chlamydia infection    treated   Hx of trichomoniasis    treated   Obesity    PIH (pregnancy induced hypertension) 10/03/2014    Patient Active Problem List   Diagnosis Date Noted   S/P cesarean section 10/17/2014   Pre-eclampsia, antepartum 10/13/2014   PIH (pregnancy induced hypertension) 10/03/2014   [redacted] weeks gestation of pregnancy    Encounter for (NT) nuchal translucency scan     Past Surgical History:  Procedure Laterality Date   CESAREAN SECTION N/A 10/17/2014   Procedure: CESAREAN SECTION;  Surgeon: Kathreen Cosier, MD;  Location: [facility];  Service: Obstetrics;  Laterality: N/A;   NO PAST SURGERIES       OB History    Gravida  1   Para  1   Term      Preterm  1   AB      Living  1     SAB      TAB      Ectopic      Multiple  0   Live Births  1            Home Medications    Prior to Admission medications   Medication Sig Start Date  End Date Taking? Authorizing Provider  amoxicillin-clavulanate (AUGMENTIN) 875-125 MG tablet Take 1 tablet by mouth every 12 (twelve) hours. 10/31/18   Jamarquis Crull, Ozella Almond, PA-C  ibuprofen (ADVIL) 800 MG tablet Take 1 tablet (800 mg total) by mouth 3 (three) times daily. 10/12/18   Palumbo, April, MD  naproxen (NAPROSYN) 500 MG tablet Take 1 tablet (500 mg total) by mouth 2 (two) times daily with a meal. 09/19/18   Palumbo, April, MD  omeprazole (PRILOSEC) 20 MG capsule Take 1 capsule (20 mg total) by mouth daily. 09/19/18   Palumbo, April, MD    Family History No family history on file.  Social History Social History   Tobacco Use   Smoking status: Never Smoker   Smokeless tobacco: Never Used  Substance Use Topics   Alcohol use: Yes    Comment: occ   Drug use: No     Allergies   Patient has no known allergies.   Review of Systems Review of Systems  Constitutional: Negative for chills and fever.  HENT: Positive for sore  throat. Negative for congestion.   Respiratory: Negative for cough and shortness of breath.   Gastrointestinal: Positive for nausea. Negative for abdominal pain, diarrhea and vomiting.  Genitourinary: Positive for dysuria and frequency. Negative for vaginal discharge.  All other systems reviewed and are negative.    Physical Exam Updated Vital Signs BP (!) 137/92 (BP Location: Left Arm)    Pulse 71    Temp 98.5 F (36.9 C) (Oral)    Resp 18    LMP 10/24/2018    SpO2 98%   Physical Exam Vitals signs and nursing note reviewed.  Constitutional:      General: She is not in acute distress.    Appearance: She is well-developed.  HENT:     Head: Normocephalic and atraumatic.     Mouth/Throat:     Comments: OP with erythema and tonsillar hypertrophy. Neck:     Musculoskeletal: Neck supple.  Cardiovascular:     Rate and Rhythm: Normal rate and regular rhythm.     Heart sounds: Normal heart sounds. No murmur.  Pulmonary:     Effort: Pulmonary effort is normal. No respiratory distress.     Breath sounds: Normal breath sounds.  Abdominal:     General: There is no distension.     Palpations: Abdomen is soft.     Comments: Mild suprapubic tenderness without rebound or guarding.  No flank or CVA tenderness.  Skin:    General: Skin is warm and dry.  Neurological:     Mental Status: She is alert and oriented to person, place, and time.      ED Treatments / Results  Labs (all labs ordered are listed, but only abnormal results are displayed) Labs Reviewed  URINALYSIS, ROUTINE W REFLEX MICROSCOPIC - Abnormal; Notable for the following components:      Result Value   APPearance CLOUDY (*)    Hgb urine dipstick SMALL (*)    Nitrite POSITIVE (*)    All other components within normal limits  URINALYSIS, MICROSCOPIC (REFLEX) - Abnormal; Notable for the following components:   Bacteria, UA MANY (*)    All other components within normal limits  GROUP A STREP BY PCR  PREGNANCY, URINE     EKG None  Radiology No results found.  Procedures Procedures (including critical care time)  Medications Ordered in ED Medications - No data to display   Initial Impression / Assessment and Plan / ED Course  I  have reviewed the triage vital signs and the nursing notes.  Pertinent labs & imaging results that were available during my care of the patient were reviewed by me and considered in my medical decision making (see chart for details).       Dalene CarrowMorgan Lasecki is a 28 y.o. female who presents to ED for two complaints:   1. Sore throat.  Diagnosed with strep throat by PCP on 9/17. Sounds as if she intermittently has been taking ABX but not daily as directed. She has now taken all but one pill. Over the last 2-3 days, her sore throat sxs have returned. She did have G&C oral swab done at PCP at that time as well. On exam today, she does have erythema and tonsillar hypertrophy.  Maintaining secretions well.  Afebrile.  2.  Urinary frequency and dysuria. Non-surgical abdomen on exam. UA nitrite + with many bacteria and 21-50 WBC's. Interestingly negative leukocytes. She reports having full STD testing a couple of weeks ago. Offered repeat testing, but she declined.   Will treat with Augmentin which should cover for both #1 and #2.  PCP follow up encouraged.  Return precautions discussed and all questions answered.   Patient discussed with Dr. Particia NearingHaviland who agrees with treatment plan.    Final Clinical Impressions(s) / ED Diagnoses   Final diagnoses:  Lower urinary tract infectious disease  Sore throat    ED Discharge Orders         Ordered    amoxicillin-clavulanate (AUGMENTIN) 875-125 MG tablet  Every 12 hours,   Status:  Discontinued     10/31/18 1608    amoxicillin-clavulanate (AUGMENTIN) 875-125 MG tablet  Every 12 hours     10/31/18 1614           Nikola Blackston, Chase PicketJaime Pilcher, PA-C 10/31/18 1638    Jacalyn LefevreHaviland, Julie, MD 10/31/18 1646

## 2018-10-31 NOTE — ED Notes (Signed)
Pt called for triage. No response.  

## 2018-10-31 NOTE — Discharge Instructions (Signed)
It was my pleasure taking care of you today!   Please take all of your antibiotics until finished!  Stay very well hydrated with plenty of water throughout the day.  Follow up with primary care physician in 1 week for recheck of ongoing symptoms.  Please seek immediate care if you develop the following: Your symptoms are no better or worse in 3 days. There is severe back pain or lower abdominal pain.  You develop a fever There is vomiting.  There is continued burning or discomfort with urination after antibiotics are completed. New or worsening symptoms develop or you have any additional concerns.

## 2018-11-28 DEATH — deceased

## 2020-09-22 IMAGING — CT CT ANGIO CHEST
2 of 9 series · 18 of 36 positions shown · IV contrast (omnipaque)
Comparison: 09/19/2018

CLINICAL DATA: Chest tightness with left arm pain

EXAM:
CT ANGIOGRAPHY CHEST WITH CONTRAST
TECHNIQUE: Multidetector CT imaging of the chest was performed using the
standard protocol during bolus administration of intravenous
contrast. Multiplanar CT image reconstructions and MIPs were
obtained to evaluate the vascular anatomy.
CONTRAST:  100mL OMNIPAQUE IOHEXOL 350 MG/ML SOLN

[Series 6: pe coronal mpr · coronal · 0.52mm/px · 1 of 60 slices shown]
[im 30/60  mediastinal]
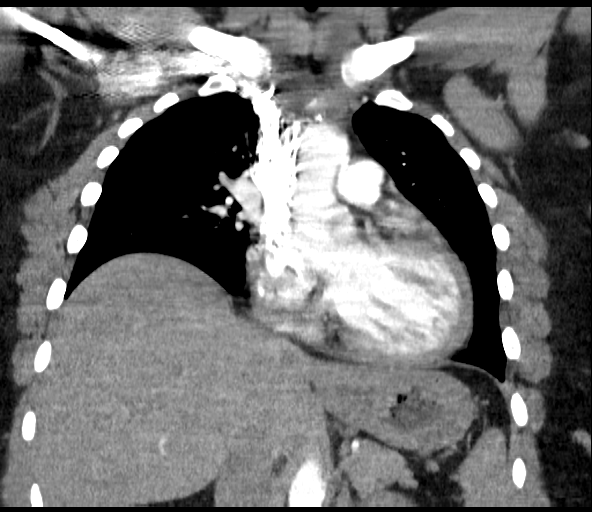

[Series 10: pe thins · axial · 0.60mm/px · z∈[-290,-62]mm · 17 of 343 slices shown]
[im 20/343  lung]
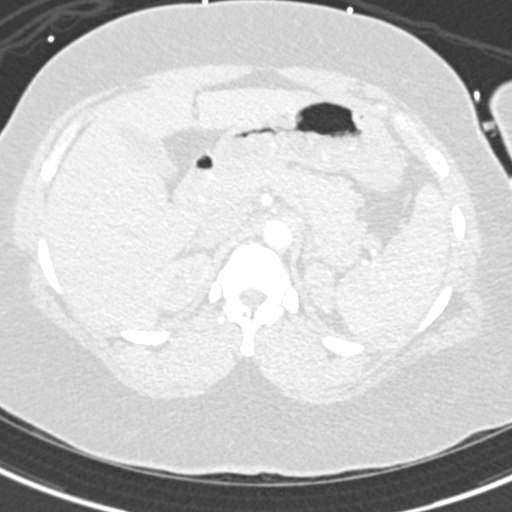
[im 39/343  mediastinal]
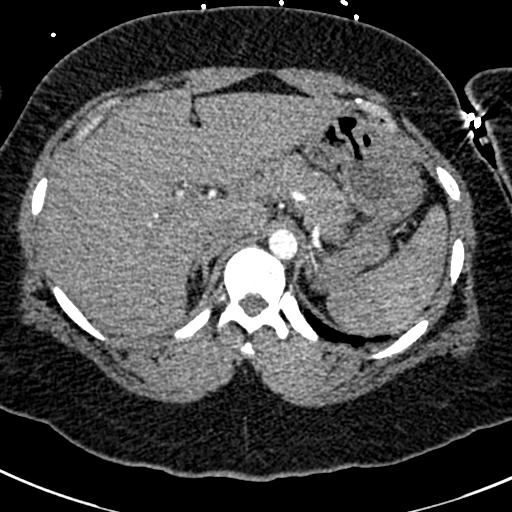
[im 58/343  lung]
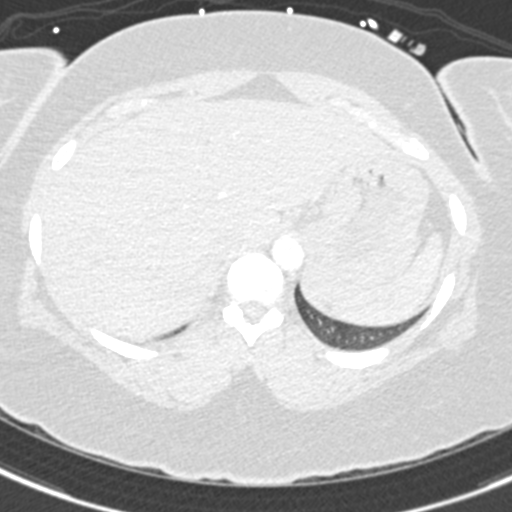
[im 77/343  mediastinal]
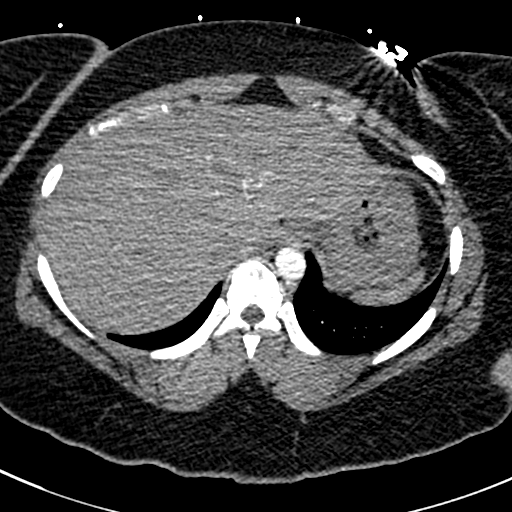
[im 96/343  lung]
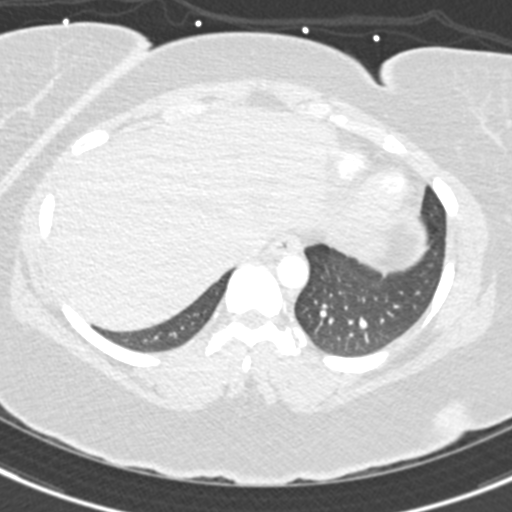
[im 115/343  mediastinal]
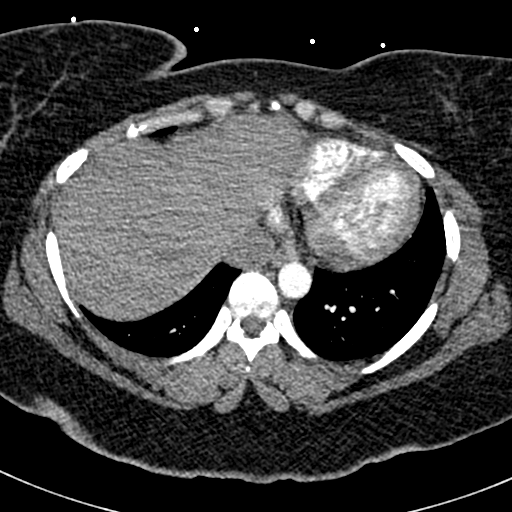
[im 134/343  lung]
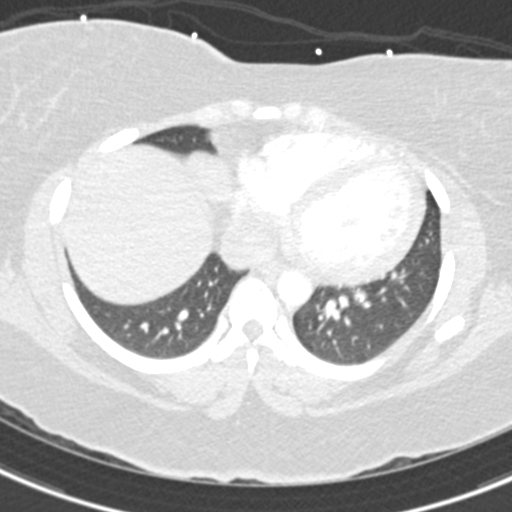
[im 153/343  mediastinal]
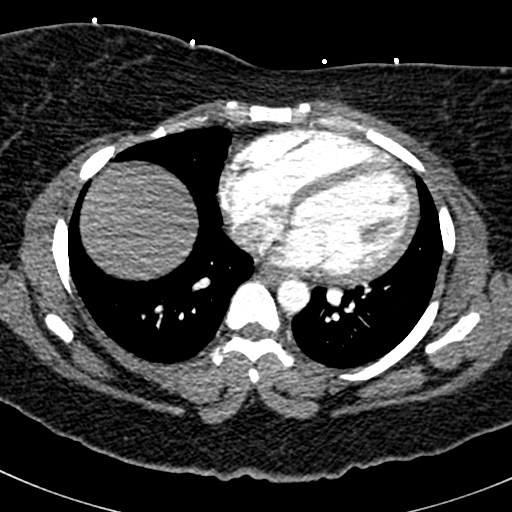
[im 172/343  lung]
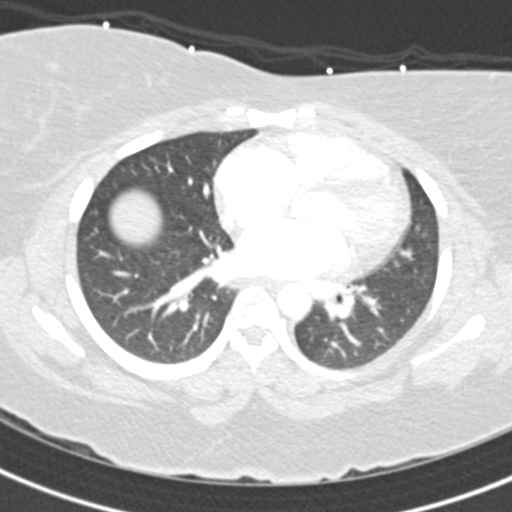
[im 191/343  mediastinal]
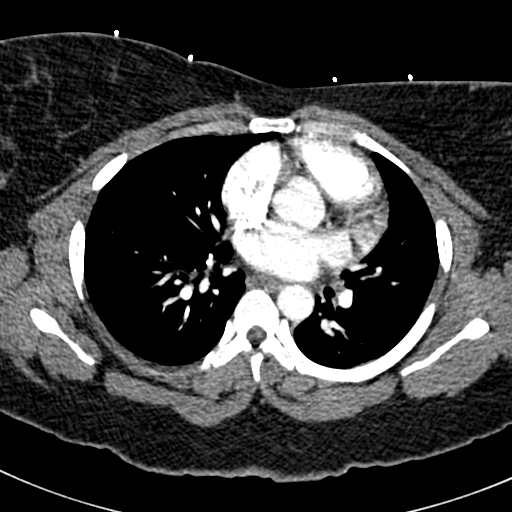
[im 210/343  lung]
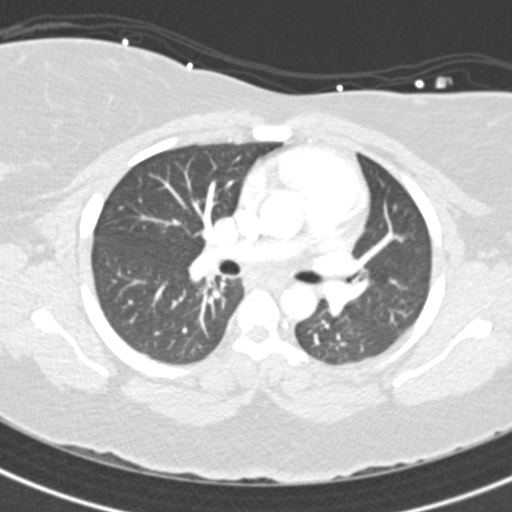
[im 229/343  mediastinal]
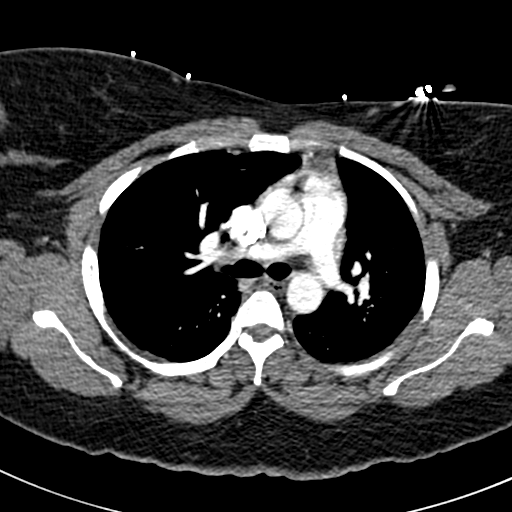
[im 248/343  lung]
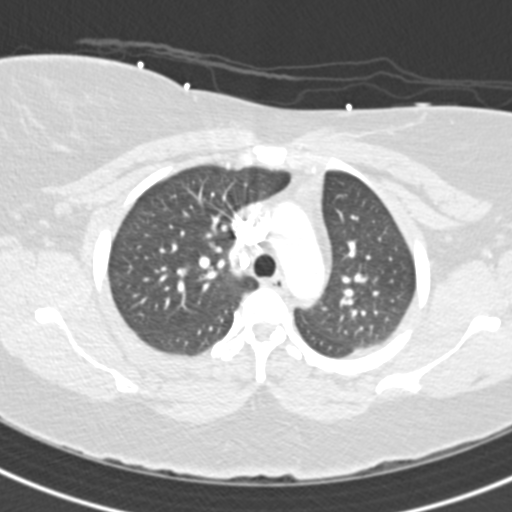
[im 267/343  mediastinal]
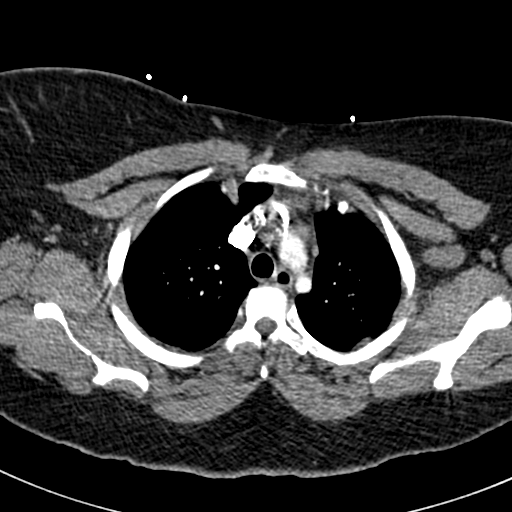
[im 286/343  lung]
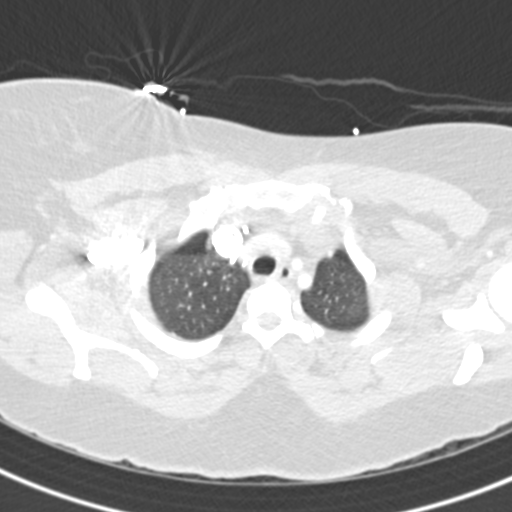
[im 305/343  mediastinal]
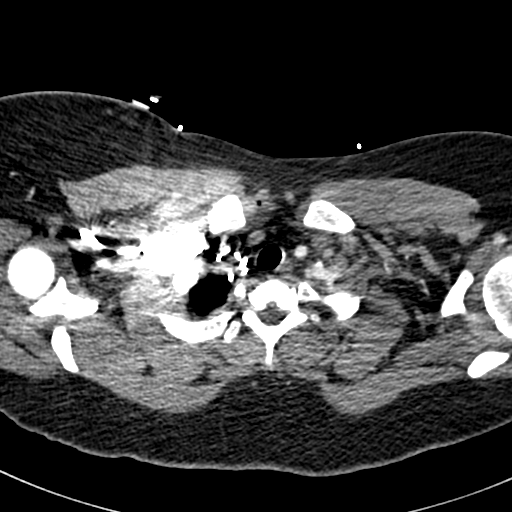
[im 324/343  lung]
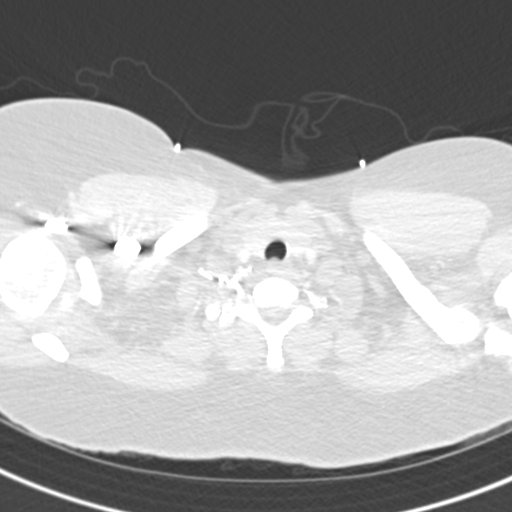

[18 of 36 positions shown; findings below may reference images not displayed]

FINDINGS: Cardiovascular: Satisfactory opacification of the pulmonary arteries
to the segmental level. No evidence of pulmonary embolism. Normal
heart size. No pericardial effusion.

Mediastinum/Nodes: No adenopathy or mass

Lungs/Pleura: There is no edema, consolidation, effusion, or
pneumothorax.

Upper Abdomen: Negative

Musculoskeletal: No explanation for chest pain.

Review of the MIP images confirms the above findings.
IMPRESSION: Stable and negative chest CTA.
